# Patient Record
Sex: Female | Born: 1995 | Race: White | Hispanic: No | Marital: Married | Smoking: Never smoker
Health system: Southern US, Community
[De-identification: ages and names within clinical notes are randomized; demographics above are authoritative.]

## PROBLEM LIST (undated history)

## (undated) DIAGNOSIS — Z8781 Personal history of (healed) traumatic fracture: Secondary | ICD-10-CM

## (undated) DIAGNOSIS — N96 Recurrent pregnancy loss: Secondary | ICD-10-CM

## (undated) HISTORY — PX: FRACTURE SURGERY: SHX138

## (undated) HISTORY — DX: Personal history of (healed) traumatic fracture: Z87.81

## (undated) HISTORY — DX: Recurrent pregnancy loss: N96

---
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
## 2013-09-25 ENCOUNTER — Emergency Department: Payer: Self-pay | Admitting: Emergency Medicine

## 2013-09-25 LAB — URINALYSIS, COMPLETE
Bilirubin,UR: NEGATIVE
GLUCOSE, UR: NEGATIVE mg/dL (ref 0–75)
KETONE: NEGATIVE
LEUKOCYTE ESTERASE: NEGATIVE
Nitrite: NEGATIVE
PROTEIN: NEGATIVE
Ph: 7 (ref 4.5–8.0)
RBC,UR: 3 /HPF (ref 0–5)
SPECIFIC GRAVITY: 1.015 (ref 1.003–1.030)
Squamous Epithelial: 10

## 2013-09-25 LAB — CBC
HCT: 37.1 % (ref 35.0–47.0)
HGB: 12.4 g/dL (ref 12.0–16.0)
MCH: 30.1 pg (ref 26.0–34.0)
MCHC: 33.6 g/dL (ref 32.0–36.0)
MCV: 90 fL (ref 80–100)
Platelet: 231 10*3/uL (ref 150–440)
RBC: 4.14 10*6/uL (ref 3.80–5.20)
RDW: 12.8 % (ref 11.5–14.5)
WBC: 5.8 10*3/uL (ref 3.6–11.0)

## 2013-09-25 LAB — HCG, QUANTITATIVE, PREGNANCY: Beta Hcg, Quant.: 26599 m[IU]/mL — ABNORMAL HIGH

## 2013-09-28 ENCOUNTER — Emergency Department: Payer: Self-pay | Admitting: Emergency Medicine

## 2013-09-28 LAB — HCG, QUANTITATIVE, PREGNANCY: BETA HCG, QUANT.: 19786 m[IU]/mL — AB

## 2013-09-28 LAB — CBC
HCT: 36 % (ref 35.0–47.0)
HGB: 11.8 g/dL — ABNORMAL LOW (ref 12.0–16.0)
MCH: 29.5 pg (ref 26.0–34.0)
MCHC: 32.9 g/dL (ref 32.0–36.0)
MCV: 90 fL (ref 80–100)
Platelet: 234 10*3/uL (ref 150–440)
RBC: 4.01 10*6/uL (ref 3.80–5.20)
RDW: 12.9 % (ref 11.5–14.5)
WBC: 5.1 10*3/uL (ref 3.6–11.0)

## 2013-10-10 ENCOUNTER — Ambulatory Visit: Payer: Self-pay | Admitting: Obstetrics and Gynecology

## 2013-10-11 LAB — COMPREHENSIVE METABOLIC PANEL
ALK PHOS: 54 U/L
ALT: 58 U/L
ANION GAP: 5 — AB (ref 7–16)
Albumin: 3.9 g/dL (ref 3.4–5.0)
BILIRUBIN TOTAL: 0.2 mg/dL (ref 0.2–1.0)
BUN: 11 mg/dL (ref 7–18)
CALCIUM: 8.5 mg/dL (ref 8.5–10.1)
Chloride: 106 mmol/L (ref 98–107)
Co2: 28 mmol/L (ref 21–32)
Creatinine: 0.49 mg/dL — ABNORMAL LOW (ref 0.60–1.30)
EGFR (African American): >60
EGFR (Non-African Amer.): >60
Glucose: 94 mg/dL (ref 65–99)
Osmolality: 277 (ref 275–301)
POTASSIUM: 4 mmol/L (ref 3.5–5.1)
SGOT(AST): 49 U/L — ABNORMAL HIGH (ref 15–37)
Sodium: 139 mmol/L (ref 136–145)
TOTAL PROTEIN: 7.2 g/dL (ref 6.4–8.2)

## 2013-10-11 LAB — CBC
HCT: 34.4 % — ABNORMAL LOW (ref 35.0–47.0)
HGB: 11.3 g/dL — AB (ref 12.0–16.0)
MCH: 29.3 pg (ref 26.0–34.0)
MCHC: 32.8 g/dL (ref 32.0–36.0)
MCV: 89 fL (ref 80–100)
PLATELETS: 248 10*3/uL (ref 150–440)
RBC: 3.85 10*6/uL (ref 3.80–5.20)
RDW: 12.7 % (ref 11.5–14.5)
WBC: 7.3 10*3/uL (ref 3.6–11.0)

## 2013-10-11 LAB — HCG, QUANTITATIVE, PREGNANCY: Beta Hcg, Quant.: 3944 m[IU]/mL — ABNORMAL HIGH

## 2013-10-16 LAB — PATHOLOGY REPORT

## 2014-05-03 NOTE — H&P (Signed)
Subjective/Chief Complaint Vaginal bleeding   History of Present Illness 18yo G3P1011 with LMP of 09/26/13 and an EDC of 04/30/13 presents for the third time to the ER with vaginal bleeding and cramping. Confirmed IUP with bhcg of: 9/16 26599 9/19- 19786 10/1- 3944  Confirmed failed pregnancy at last visit, has been expectantly managing as outpatient. However, she has begun passing large clots and is worried, has been bleeding for 2 weeks. +cramping pain, not severe. No headaches, dizziness, light-headedness, pelvic pain, discharge or odor. She is Rh positive, hgb stable.  Pt was on Nuva ring, lost her insurance, and has been using condoms for 1.5 yrs. Sexually active with boyfriend only. Unplanned pregnancy.   Past History Past OB Hx- 1 NSVD, uncomplicated pregnancy 3 yrs ago. 1 SAB, expectantly managed. Has used OCPs in past, made her feel ill. Past medical hx- none Past surgical hx- tibial fx and clavicle fx as child, eye surgery Past family hx- mother with dysmenorrhea and menorrhagia. No gyn cancers.   Primary Physician Patient uncertain - on [address]   Code Status Full Code   Past Med/Surgical Hx:  Denies medical history:   ALLERGIES:  No Known Allergies:   Family and Social History:  Place of Living Home   Review of Systems:  Fever/Chills No   Cough No   Sputum No   Abdominal Pain No   Diarrhea No   Constipation No   Nausea/Vomiting No   SOB/DOE No   Chest Pain No   Telemetry Reviewed Afib   Dysuria No   Tolerating PT Yes   Tolerating Diet Yes   Medications/Allergies Reviewed Medications/Allergies reviewed   Physical Exam:  GEN well developed, well nourished, no acute distress   HEENT PERRL, good dentition   NECK supple   RESP normal resp effort  clear BS   CARD regular rate  no murmur   ABD no liver/spleen enlargement  soft  normal BS  no Adominal Mass   GU Cervix dilated 1 cm, uterus retroverted approx 8 week sized, dark red  blood from os. No cervical motion tenderness, no adnexal tenderness or masses.   EXTR negative cyanosis/clubbing, negative edema   NEURO cranial nerves intact   PSYCH alert, A+O to time, place, person, good insight   Lab Results: Hepatic:  01-Oct-15 23:41   Bilirubin, Total 0.2  Alkaline Phosphatase 54 (46-116 NOTE: New Reference Range 07/30/13)  SGPT (ALT) 58 (14-63 NOTE: New Reference Range 07/30/13)  SGOT (AST)  49  Total Protein, Serum 7.2  Albumin, Serum 3.9  Routine Chem:  01-Oct-15 23:41   Glucose, Serum 94  BUN 11  Creatinine (comp)  0.49  Sodium, Serum 139  Potassium, Serum 4.0  Chloride, Serum 106  CO2, Serum 28  Calcium (Total), Serum 8.5  Osmolality (calc) 277  eGFR (African American) >60  eGFR (Non-African American) >60 (eGFR values <71m/min/1.73 m2 may be an indication of chronic kidney disease (CKD). Calculated eGFR, using the MRDR Study equation, is useful in  patients with stable renal function. The eGFR calculation will not be reliable in acutely ill patients when serum creatinine is changing rapidly. It is not useful in patients on dialysis. The eGFR calculation may not be applicable to patients at the low and high extremes of body sizes, pregnant women, and vetetarians.)  Anion Gap  5  HCG Betasubunit Quant. Serum  3944 (1-3  (International Unit)  ----------------- Non-pregnant <5 Weeks Post LMP mIU/mL  3- 4 wk 9 - 130  4- 5  wk 75 - 2,600  5- 6 wk 850 - 20,800  6- 7 wk 4,000 - 100,000  7-12 wk 11,500 - 289,000 12-16 wk 18,000 - 137,000 16-29 wk 1,400 - 53,000 29-41 wk 940 - 60,000)  Routine Hem:  01-Oct-15 23:41   WBC (CBC) 7.3  RBC (CBC) 3.85  Hemoglobin (CBC)  11.3  Hematocrit (CBC)  34.4  Platelet Count (CBC) 248 (Result(s) reported on 11 Oct 2013 at 12:03AM.)  MCV 89  MCH 29.3  MCHC 32.8  RDW 12.7   Radiology Results: [place]:    02-Oct-15 02:16, US OB Less Than 14 Weeks w/ Transvaginal  US OB Less Than 14 Weeks w/  Transvaginal  REASON FOR EXAM:    pelvic pain, vaginal bleeding  COMMENTS:       PROCEDURE: [place]  - US OB LESS THAN 14 WEEKS/W TRANS  - Oct 11 2013  2:16AM     CLINICAL DATA:  Pregnant, vaginal bleeding, declining beta HCG  (3944)    EXAM:  OBSTETRIC <14 WK [place] AND TRANSVAGINAL OB US    TECHNIQUE:  Both transabdominal and transvaginal ultrasound examinations were  performed for complete evaluation of the gestation as well as the  maternal uterus, adnexal regions, and pelvic cul-de-sac.  Transvaginal technique wasperformed to assess early pregnancy.    COMPARISON:  09/25/2013    FINDINGS:  Intrauterine gestational sac: Visualized, irregular in shape    Yolk sac:  Present    Embryo:  Not visualized    Irregular gestational sac with mean sac diameter 29.1 mm, estimated  gestational age [redacted] weeks 8 days.    Maternal uterus/adnexae: No subchorionic hemorrhage.  Abnormal soft tissue/debris within the gestational sac without  definite fetal pole.    Fluid within the endocervical canal.    Bilateral ovaries are not discretely visualized.    No free fluid.     IMPRESSION:  Failed intrauterine pregnancy, as above.      Electronically Signed    By: Julian Hy M.D.    On: 10/11/2013 02:41         Verified By: Julian Hy, M.D.,    Assessment/Admission Diagnosis Incomplete abortion with continued bleeding  TVUS GSD cm with yolk sac cm and no fetal pole   Plan To OR for D&C. Consents signed with patient. No Rhogam indicated. Risks, benefits and alternatives discussed at length with patient. She opts for surgical management given several weeks of bleeding and continued cramping and clots. Preop Doxycycline x1 F/u 2 weeks postop Contraception offered. Pt to consider before preop.   Electronic Signatures: Angelina Pih (MD)  (Signed 02-Oct-15 06:13)  Authored: CHIEF COMPLAINT and HISTORY, PAST MEDICAL/SURGIAL HISTORY, ALLERGIES, FAMILY AND SOCIAL HISTORY,  REVIEW OF SYSTEMS, PHYSICAL EXAM, LABS, Radiology, ASSESSMENT AND PLAN   Last Updated: 02-Oct-15 06:13 by Angelina Pih (MD)

## 2014-05-03 NOTE — Op Note (Signed)
PATIENT NAME:  Sharon Houston, Sharon Houston MR#:  [id number] DATE OF BIRTH:  [birth year]  DATE OF PROCEDURE:  10/11/2013  PREOPERATIVE DIAGNOSIS:  Incomplete abortion.    POSTOPERATIVE DIAGNOSIS:  Incomplete abortion.     PROCEDURE:   Suction dilatation and curettage.    ATTENDING:  Cline Cools, MD    ASSISTANT:  None.    ESTIMATED BLOOD LOSS:  200 mL.    INTRAVENOUS FLUIDS:  400 mL   URINE OUTPUT: 200 mL.    FINDINGS:  Approximately 10 week size uterus with products of conception.  Cervix dilated to 1 cm.    INDICATION FOR PROCEDURE:  Sharon Houston is a 19 year old, gravida 3, para 1, 0-1-1 who presents to the Emergency Room with vaginal bleeding.  Her last menstrual period was 07/26/2013 giving her an estimated date of confinement of 11 weeks by dates.  The patient had previously been diagnosed with an incomplete abortion on 09/28/2013 when she presented to the Emergency Room with vaginal bleeding and a declining beta hCG.  She had opted for expectant management at that time, however, on her presentation to the ER this time her beta hCG was 3900 and an ultrasound confirmed an empty gestational sac of 3.9 cm with no fetal pole and tissue in the cervix.  These findings were discussed with the patient and she elected for surgical management at this time.  The risks of the procedure were discussed with the patient including increasing bleeding, infection, uterine perforation, uterine adhesions as well as the benefits of the procedure including shorter time to resolution of her incomplete abortion.  The alternatives of the procedure were also discussed with the patient. She gave verbal and written consent to go ahead with D and C.    DESCRIPTION OF PROCEDURE: The patient was taken to the operating room where she was identified by name and birthdate.  She was placed do on the operating table in the supine position and general anesthesia was induced without difficulty.  The patient was then placed in  dorsal-lithotomy and she was prepped and draped in the usual sterile fashion.  A single dose of doxycycline was given prior to the start of the procedure.  A formal timeout was performed with all team members present and in agreement.    On exam under anesthesia, the patient was noted to have a significantly retroverted uterus of approximately 10 week size.  Her cervix was dilated to 1 cm and bright red bleeding was noted from the cervical os.    A weighted speculum was placed in the vagina.  The cervix was grasped with a single tooth tenaculum.  A 21 French Hank dilator was introduced into the cervical os, which was easily performed and the cervix did not require further dilation.  A size 8 mm rigid curved suction curette was then introduced into the uterus carefully against the fundus and suction curettage was performed for products of  conception. Sharp curettage was used to assure a gritty texture in all quadrants of the uterus.  Bimanual massage was used to firm up the fundus.  There was continued bright red bleeding and 1 dose of 0.2 mg of IM Methergine was given.    At the close of the procedure bleeding from the cervical os was very minimal, the fundus was firm and all products had been removed and sent to pathology.  The single tooth tenaculum was then removed from the anterior lip of the cervix.  Hemostasis was assured using pressure only at  the tenaculum sites.  The weight speculum was then removed from the vagina and the procedure was completed.    The patient tolerated this procedure well and was removed in stable condition to the postop care area.      ____________________________ Cline Cools, MD beb:AT D: 10/11/2013 17:10:40 ET T: 10/12/2013 07:00:38 ET JOB#: [id number]  cc: Cline Cools, MD, <Dictator> Cline Cools MD ELECTRONICALLY SIGNED 11/13/2013 17:52

## 2015-06-28 IMAGING — US US OB < 14 WEEKS - US OB TV
1 series · 14 of 28 positions shown · non-contrast
Comparison: None.

CLINICAL DATA: Eight weeks pregnant, vaginal bleeding and cramping.
Estimated gestational age last menstrual period equals 8 weeks 5
days.

EXAM:
OBSTETRIC <14 WK US AND TRANSVAGINAL OB US
TECHNIQUE: Both transabdominal and transvaginal ultrasound examinations were
performed for complete evaluation of the gestation as well as the
maternal uterus, adnexal regions, and pelvic cul-de-sac.
Transvaginal technique was performed to assess early pregnancy.

[Series 1: us ob < 14 weeks - us ob tv · 0.24mm/px · 75 acquisitions, 14 frames shown]
[im 3/75]
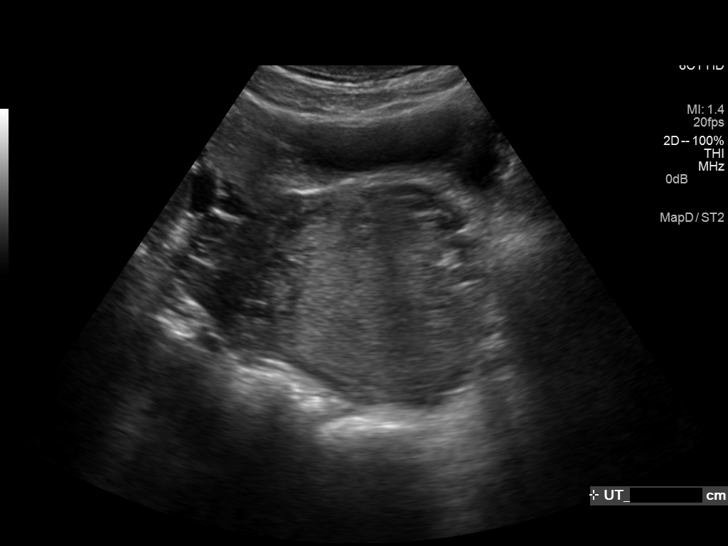
[im 9/75]
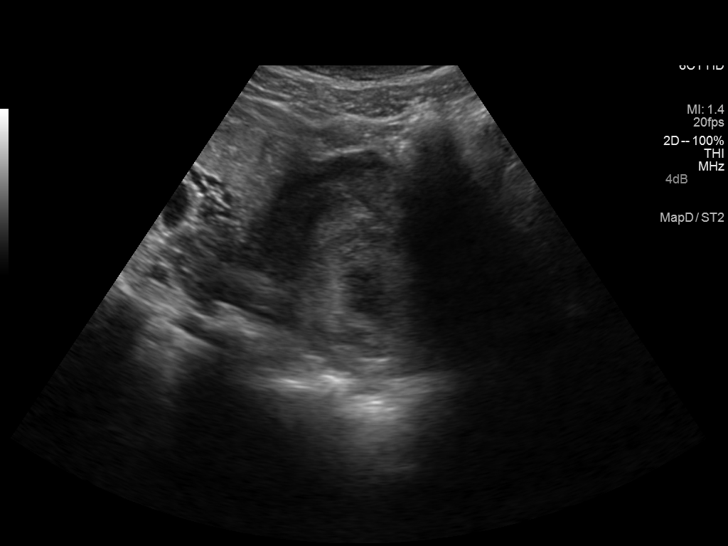
[im 14/75]
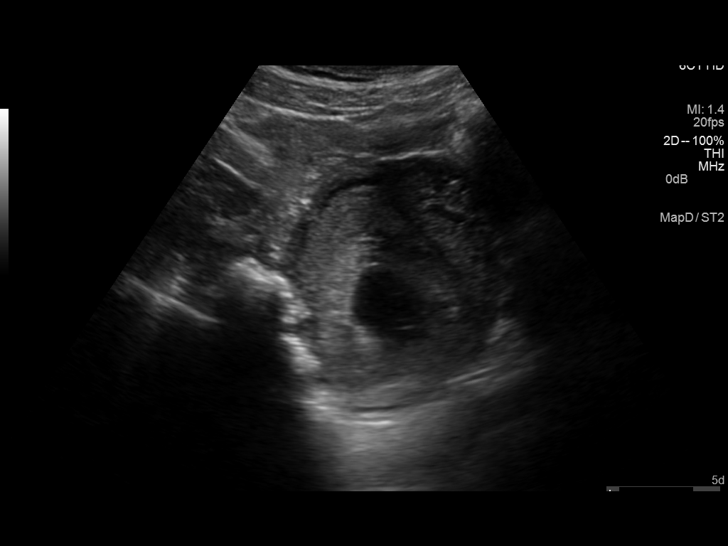
[im 20/75]
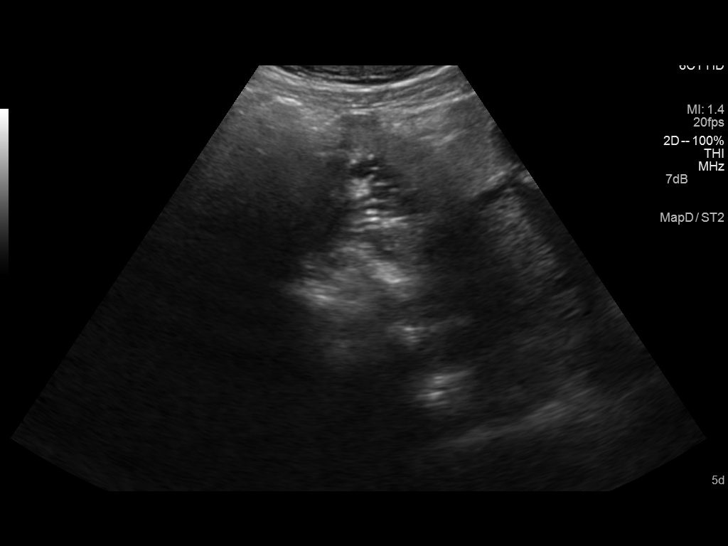
[im 25/75]
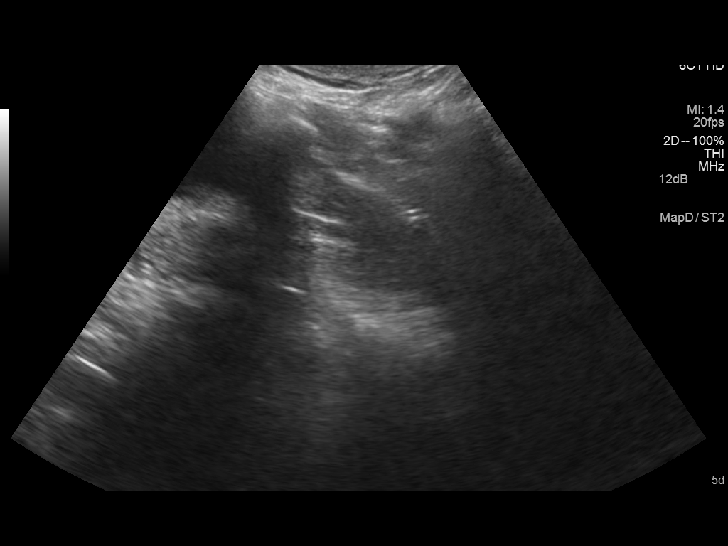
[im 31/75]
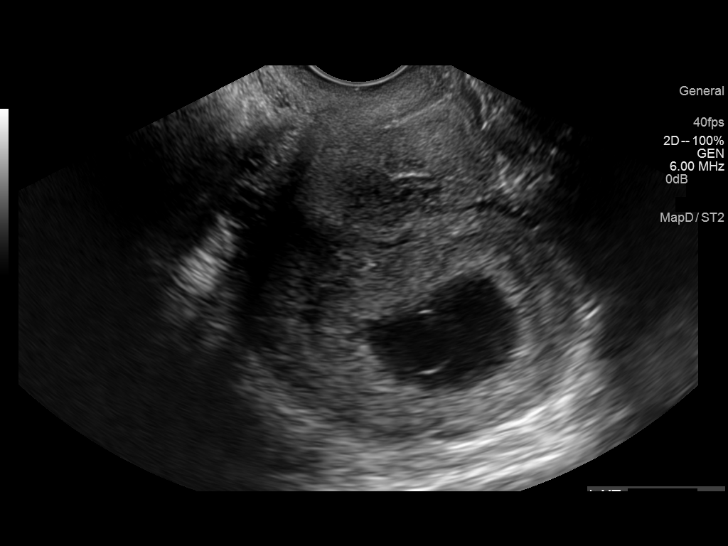
[im 36/75]
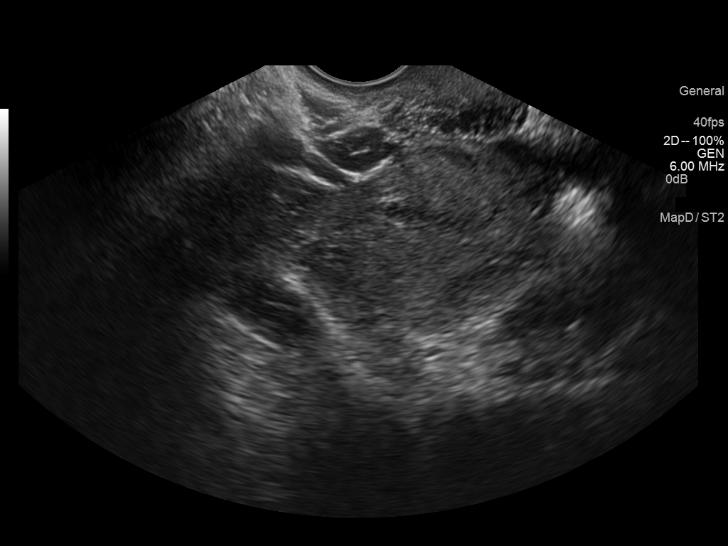
[im 42/75]
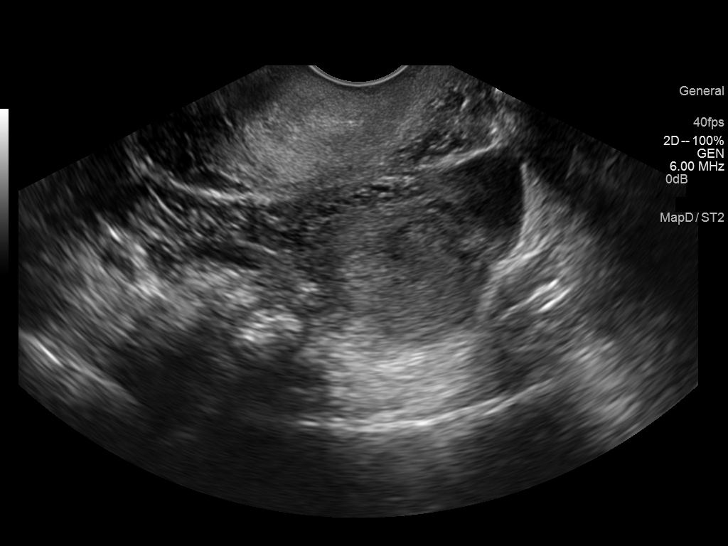
[im 47/75]
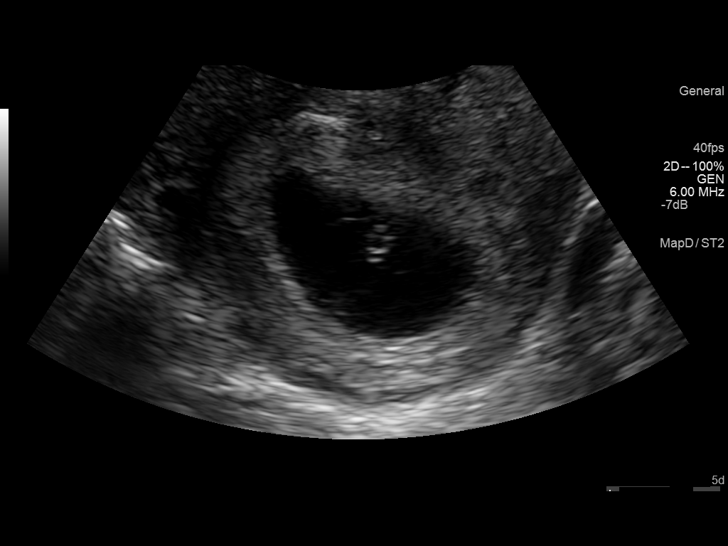
[im 53/75]
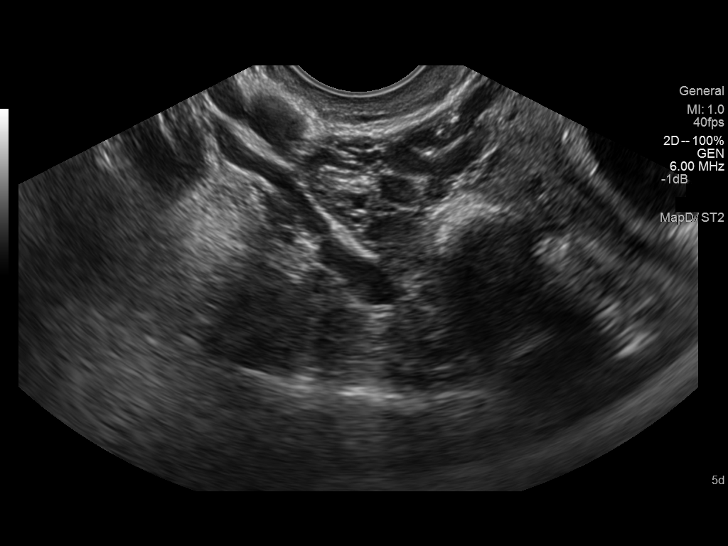
[im 58/75]
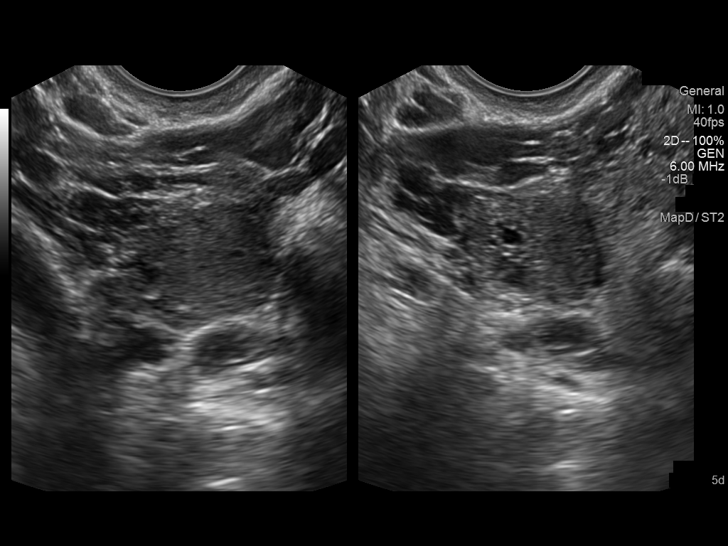
[im 64/75]
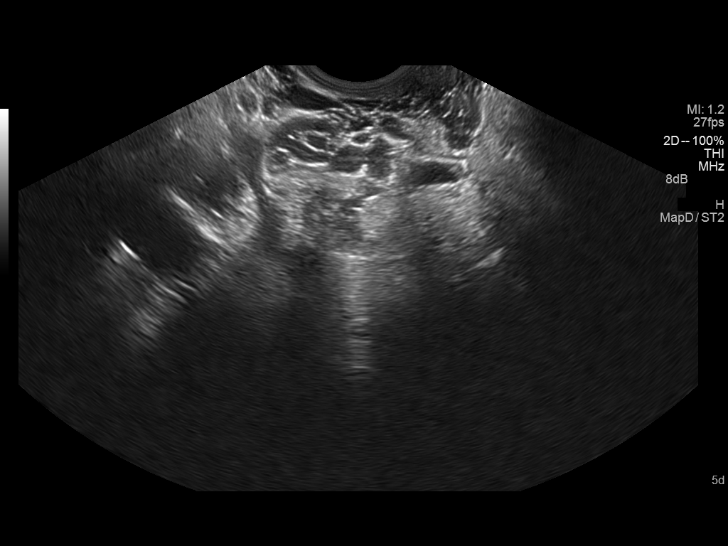
[im 69/75]
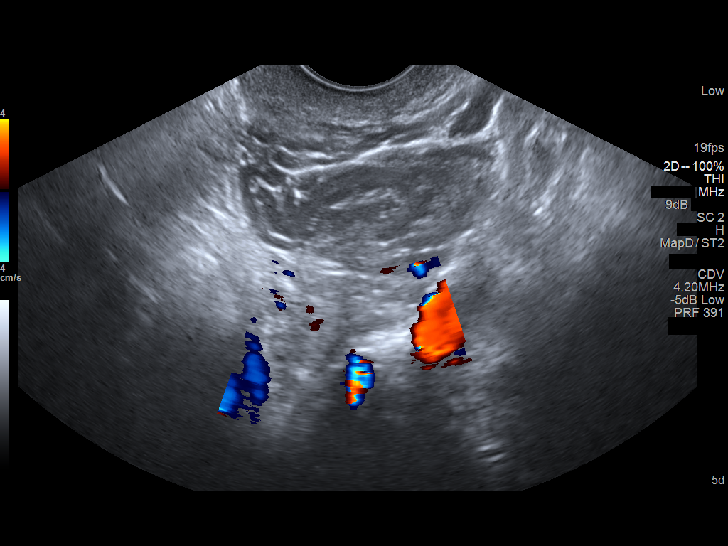
[im 75/75]
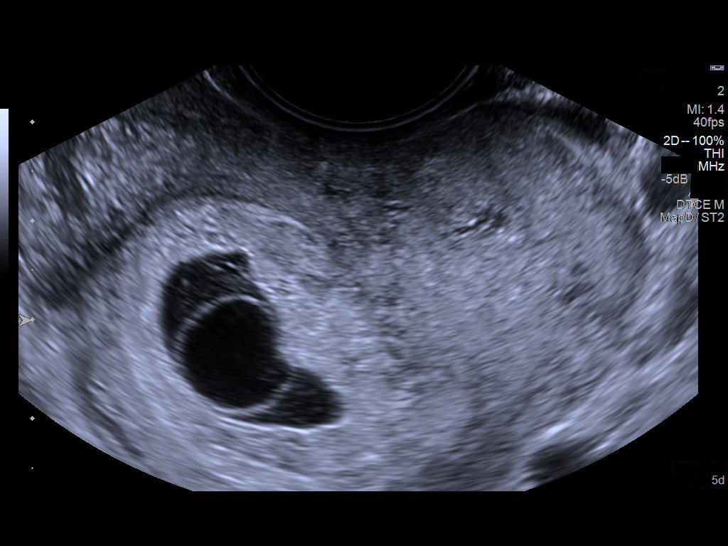

[14 of 28 positions shown; findings below may reference images not displayed]

FINDINGS: Intrauterine gestational sac: Single

Yolk sac:  Present

Embryo:  Present

Cardiac Activity: Present

Heart Rate:  114 bpm

CRL:   5.3  mm   7 w 4 d                  US EDC: 05/14/2014

Maternal uterus/adnexae: Normal ovaries. Potential small chorionic
hemorrhage. The gestational sac appears slightly small with some
internal echoes which could indicate hemorrhage. No free fluid.
Small volume free fluid
IMPRESSION: 1. Single intrauterine gestation with cardiac activity.
2. Estimated gestational age by crown-rump length equals 7 weeks 4
days.
3. Gestational sac is slightly irregular which could could indicate
threatened abortion.

## 2015-07-14 IMAGING — US US OB < 14 WEEKS - US OB TV
1 series · 14 of 28 positions shown · non-contrast
Comparison: 09/25/2013

CLINICAL DATA: Pregnant, vaginal bleeding, declining beta HCG
(6600)

EXAM:
OBSTETRIC <14 WK US AND TRANSVAGINAL OB US
TECHNIQUE: Both transabdominal and transvaginal ultrasound examinations were
performed for complete evaluation of the gestation as well as the
maternal uterus, adnexal regions, and pelvic cul-de-sac.
Transvaginal technique was performed to assess early pregnancy.

[Series 1: us ob < 14 weeks - us ob tv · 0.21mm/px · 42 acquisitions, 14 frames shown]
[im 2/42]
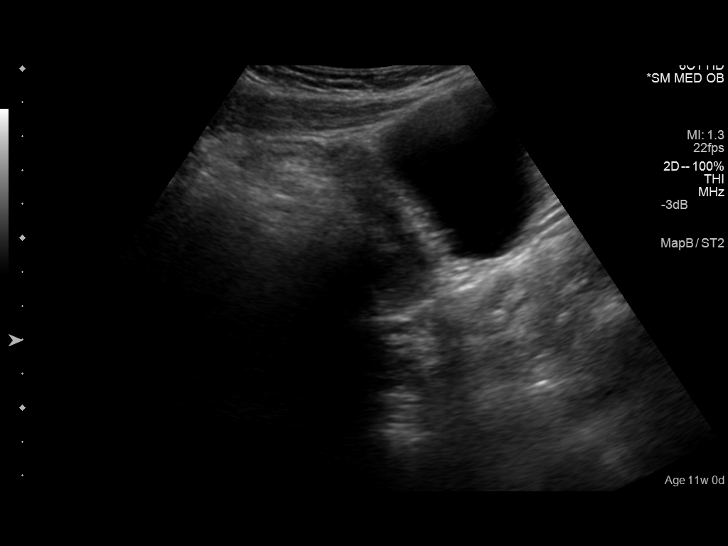
[im 5/42]
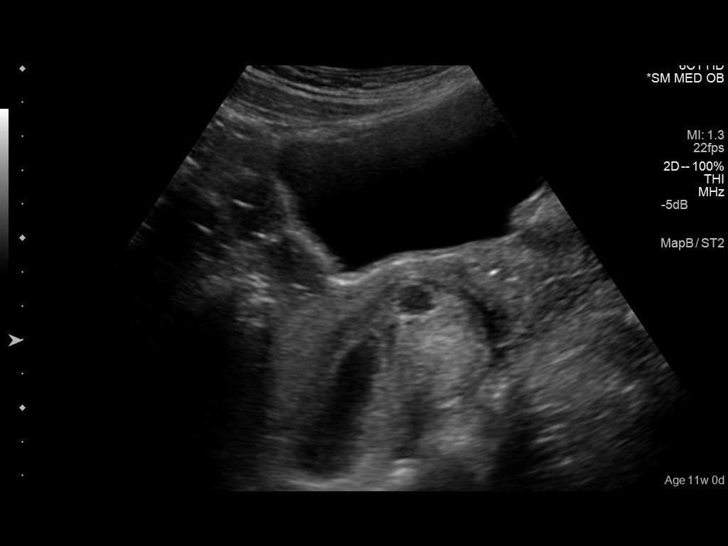
[im 8/42]
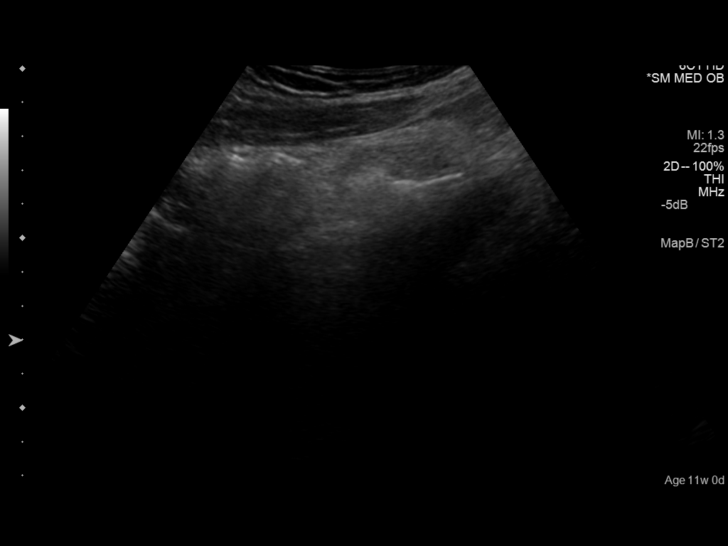
[im 11/42]
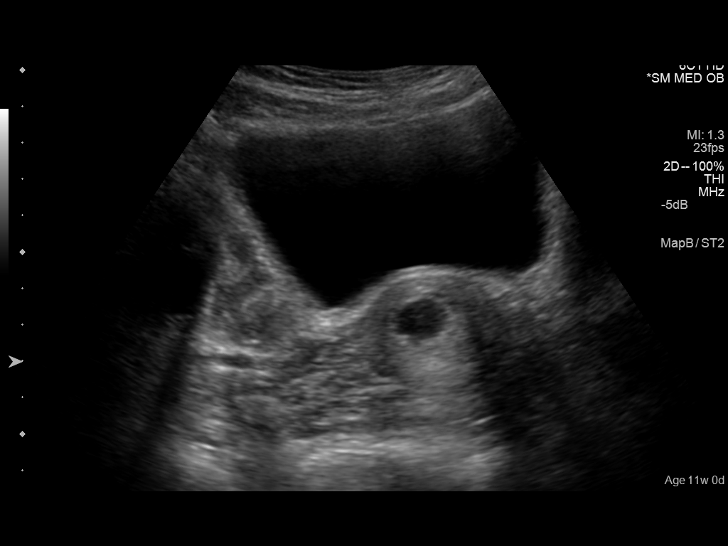
[im 14/42]
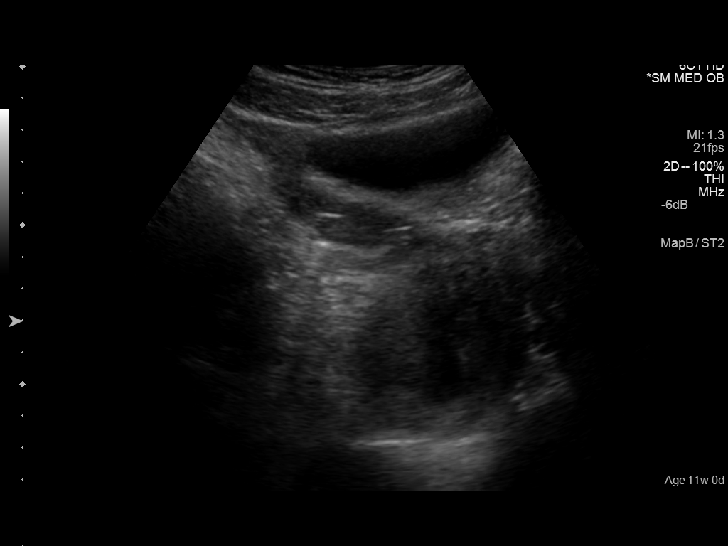
[im 17/42]
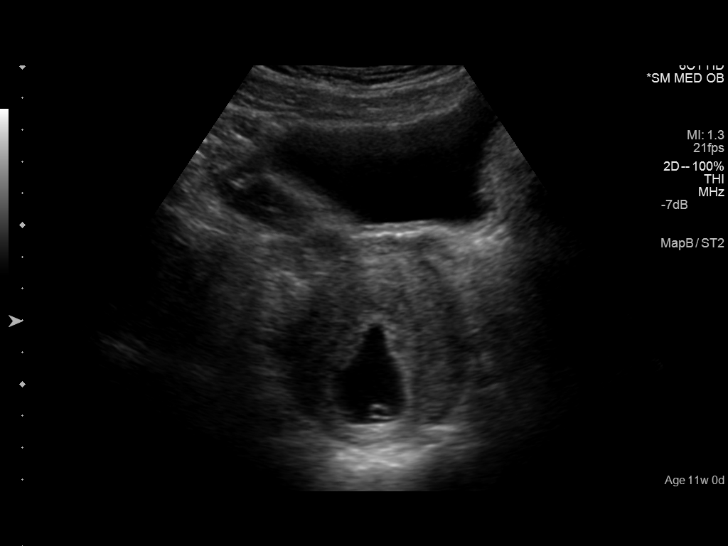
[im 20/42]
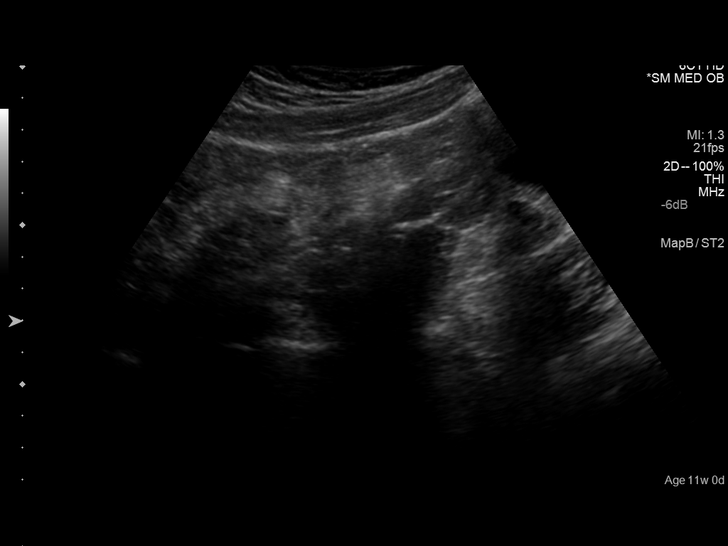
[im 23/42]
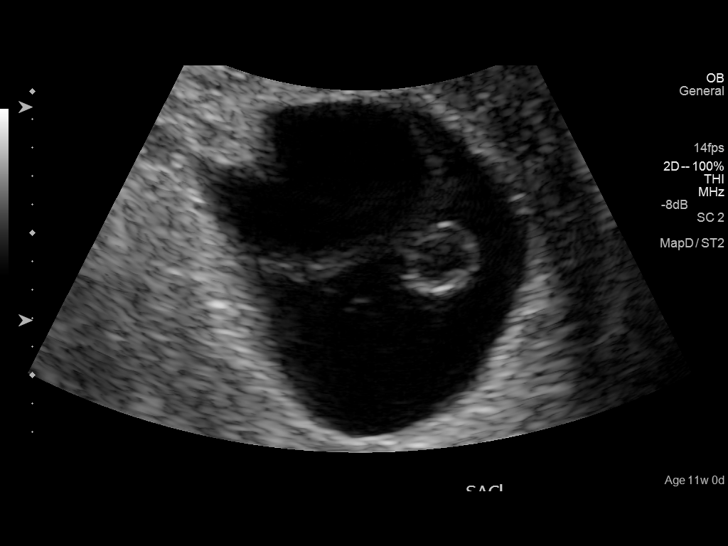
[im 26/42]
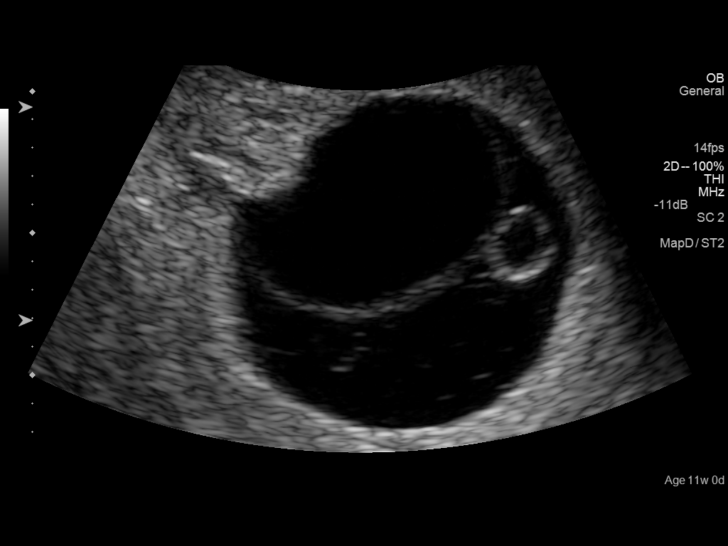
[im 29/42]
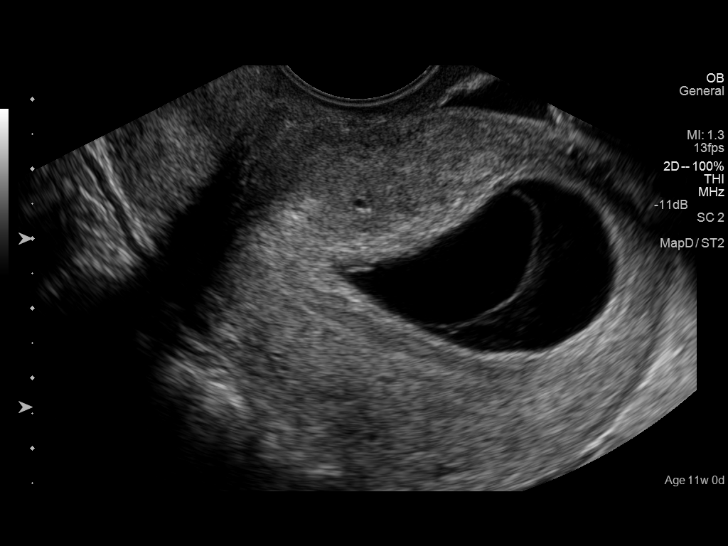
[im 32/42]
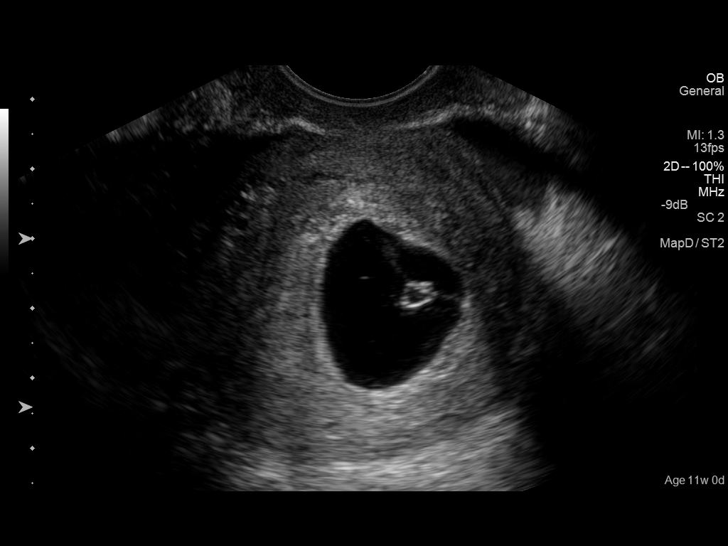
[im 35/42]
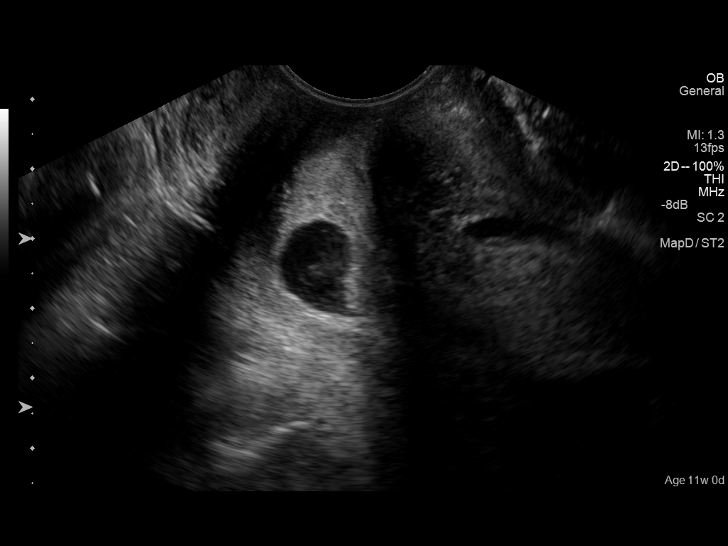
[im 38/42]
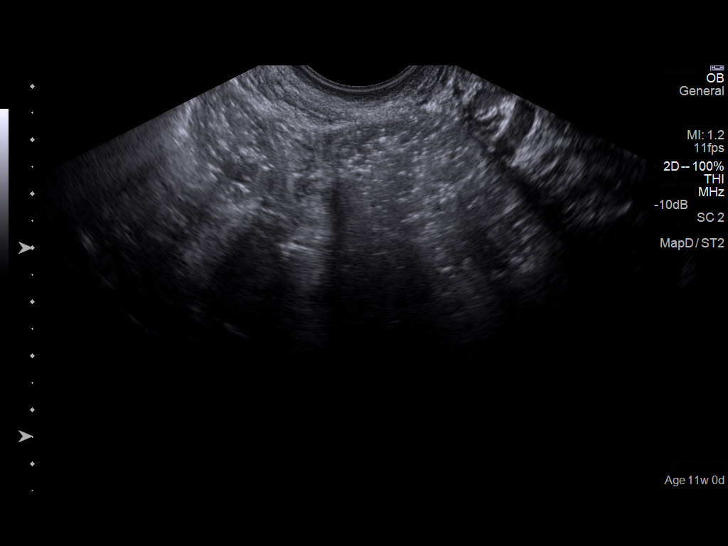
[im 42/42]
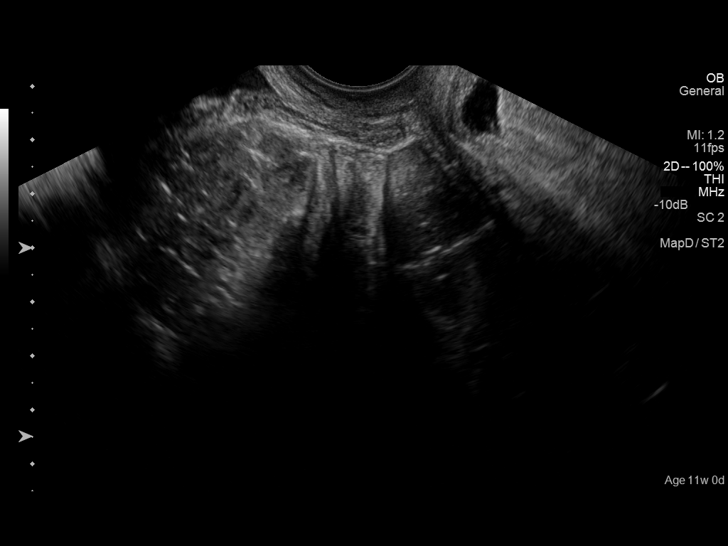

[14 of 28 positions shown; findings below may reference images not displayed]

FINDINGS: Intrauterine gestational sac: Visualized, irregular in shape

Yolk sac:  Present

Embryo:  Not visualized

Irregular gestational sac with mean sac diameter 29.1 mm, estimated
gestational age 7 weeks 8 days.

Maternal uterus/adnexae: No subchorionic hemorrhage.

Abnormal soft tissue/debris within the gestational sac without
definite fetal pole.

Fluid within the endocervical canal.

Bilateral ovaries are not discretely visualized.

No free fluid.
IMPRESSION: Failed intrauterine pregnancy, as above.

## 2016-06-10 ENCOUNTER — Emergency Department
Admission: EM | Admit: 2016-06-10 | Discharge: 2016-06-10 | Disposition: A | Discharge status: Home / Self Care | Payer: Medicaid Other | Attending: Emergency Medicine | Admitting: Emergency Medicine

## 2016-06-10 ENCOUNTER — Encounter: Payer: Self-pay | Admitting: Emergency Medicine

## 2016-06-10 DIAGNOSIS — N898 Other specified noninflammatory disorders of vagina: Secondary | ICD-10-CM | POA: Insufficient documentation

## 2016-06-10 LAB — URINALYSIS, COMPLETE (UACMP) WITH MICROSCOPIC
BILIRUBIN URINE: NEGATIVE
Bacteria, UA: NONE SEEN
GLUCOSE, UA: NEGATIVE mg/dL
Hgb urine dipstick: NEGATIVE
Ketones, ur: NEGATIVE mg/dL
LEUKOCYTES UA: NEGATIVE
Nitrite: NEGATIVE
PH: 6 (ref 5.0–8.0)
Protein, ur: NEGATIVE mg/dL
Specific Gravity, Urine: 1.013 (ref 1.005–1.030)

## 2016-06-10 LAB — CHLAMYDIA/NGC RT PCR (ARMC ONLY)
Chlamydia Tr: NOT DETECTED
N GONORRHOEAE: NOT DETECTED

## 2016-06-10 LAB — WET PREP, GENITAL
Clue Cells Wet Prep HPF POC: NONE SEEN
Sperm: NONE SEEN
Trich, Wet Prep: NONE SEEN
Yeast Wet Prep HPF POC: NONE SEEN

## 2016-06-10 LAB — POCT PREGNANCY, URINE: PREG TEST UR: NEGATIVE

## 2016-06-10 NOTE — ED Triage Notes (Signed)
Pt c/o white vaginal discharge with odor for 2 months. Tried monostat OTC without relief.  Friend had same sx and had BV and pt thinks may have same. NAD. No pain just irriation per pt.

## 2016-06-10 NOTE — ED Provider Notes (Signed)
[facility] Emergency Department Provider Note  ____________________________________________   First MD Initiated Contact with Patient 06/10/16 0818     (approximate)  I have reviewed the triage vital signs and the nursing notes.   HISTORY  Chief Complaint Vaginal Discharge    HPI Sharon Houston is a 21 y.o. female is here complaining of vaginal dischargeand odor for approximately 2 months. Patient states she is use Monistat over-the-counter without any relief. She is sexually active and does not use birth control or condoms. She states she has been with the same partner but is not completely sure that he is monogamous. Patient denies any pain but states that there is moderate amount of irritation. She rates her pain as a 0/10.   History reviewed. No pertinent past medical history.  There are no active problems to display for this patient.   History reviewed. No pertinent surgical history.  Prior to Admission medications   Medication Sig Start Date End Date Taking? Authorizing Provider  acetaminophen (TYLENOL) 500 MG tablet Take 1,000 mg by mouth every 6 (six) hours as needed for pain.    [provider]  ondansetron (ZOFRAN ODT) 4 MG disintegrating tablet Take 1 tablet (4 mg total) by mouth every 8 (eight) hours as needed for nausea. 02/23/12   Santiago GladLaisure, Heather, PA-C    Allergies Patient has no known allergies.  History reviewed. No pertinent family history.  Social History Social History  Substance Use Topics  . Smoking status: Never Smoker  . Smokeless tobacco: Never Used  . Alcohol use Yes    Review of Systems Constitutional: No fever/chills Cardiovascular: Denies chest pain. Respiratory: Denies shortness of breath. Gastrointestinal: No abdominal pain.  No nausea, no vomiting.   Genitourinary: Positive for vaginal discharge. Musculoskeletal: Negative for back pain. Skin: Negative for rash. Neurological: Negative for  headaches, focal weakness or numbness.   ____________________________________________   PHYSICAL EXAM:  VITAL SIGNS: ED Triage Vitals   Enc Vitals Group     BP 129/89     Pulse Rate 79     Resp 16     Temp 98.4 F (36.9 C)     Temp Source Oral     SpO2 100 %     Weight 150 lb (68 kg)     Height 5\' 7"  (1.702 m)     Head Circumference      Peak Flow      Pain Score      Pain Loc      Pain Edu?      Excl. in GC?     Constitutional: Alert and oriented. Well appearing and in no acute distress. Eyes: Conjunctivae are normal. PERRL. EOMI. Head: Atraumatic. Neck: No stridor.   Cardiovascular: Normal rate, regular rhythm. Grossly normal heart sounds.  Good peripheral circulation. Respiratory: Normal respiratory effort.  No retractions. Lungs CTAB. Gastrointestinal: Soft and nontender. No distention.  Genitourinary: No erythema or discharge noted exterior genitalia. There is minimal secretions noted on vaginal exam. There is no adnexal masses or tenderness noted. Negative for chandelier sign. Wet prep and cultures were obtained. Musculoskeletal: No lower extremity tenderness nor edema.  No joint effusions. Neurologic:  Normal speech and language. No gross focal neurologic deficits are appreciated. No gait instability. Skin:  Skin is warm, dry and intact. No rash noted. Psychiatric: Mood and affect are normal. Speech and behavior are normal.  ____________________________________________   LABS (all labs ordered are listed, but only abnormal results are displayed)  Labs Reviewed  WET PREP, GENITAL - Abnormal; Notable for the following:       Result Value   WBC, Wet Prep HPF POC FEW (*)    All other components within normal limits  URINALYSIS, COMPLETE (UACMP) WITH MICROSCOPIC - Abnormal; Notable for the following:    Color, Urine YELLOW (*)    APPearance HAZY (*)    Squamous Epithelial / LPF 6-30 (*)    All other components within normal limits  CHLAMYDIA/NGC RT PCR ([facility]  ONLY)  POC URINE PREG, ED  POCT PREGNANCY, URINE    PROCEDURES  Procedure(s) performed: None  Procedures  Critical Care performed: No  ____________________________________________   INITIAL IMPRESSION / ASSESSMENT AND PLAN / ED COURSE  Pertinent labs & imaging results that were available during my care of the patient were reviewed by me and considered in my medical decision making (see chart for details).  Patient was made aware that her urinalysis, wet prep were negative and did not explain her symptoms. Patient will be called if her gonorrhea or chlamydia test is positive. Patient is to follow-up with gynecologist of her choice or the gynecologist on call is listed on her discharge papers to follow-up with.     ____________________________________________   FINAL CLINICAL IMPRESSION(S) / ED DIAGNOSES  Final diagnoses:  Vaginal itching      NEW MEDICATIONS STARTED DURING THIS VISIT:  Discharge Medication List as of 06/10/2016 10:37 AM       Note:  This document was prepared using Dragon voice recognition software and may include unintentional dictation errors.    Tommi Rumps, PA-C 06/10/16 1615    Jeanmarie Plant, MD 06/10/16 [phone]

## 2016-06-10 NOTE — Discharge Instructions (Signed)
Follow-up with GYN doctor listed on your discharge papers if any continued problems. We will call on the results of testing done today when the results are received.

## 2018-03-20 ENCOUNTER — Encounter: Payer: Self-pay | Admitting: Emergency Medicine

## 2018-03-20 ENCOUNTER — Emergency Department
Admission: EM | Admit: 2018-03-20 | Discharge: 2018-03-20 | Disposition: A | Discharge status: Home / Self Care | Payer: Self-pay | Attending: Emergency Medicine | Admitting: Emergency Medicine

## 2018-03-20 ENCOUNTER — Other Ambulatory Visit: Payer: Self-pay

## 2018-03-20 DIAGNOSIS — R05 Cough: Secondary | ICD-10-CM | POA: Insufficient documentation

## 2018-03-20 DIAGNOSIS — Z20828 Contact with and (suspected) exposure to other viral communicable diseases: Secondary | ICD-10-CM | POA: Insufficient documentation

## 2018-03-20 DIAGNOSIS — R0981 Nasal congestion: Secondary | ICD-10-CM | POA: Insufficient documentation

## 2018-03-20 LAB — INFLUENZA PANEL BY PCR (TYPE A & B)
INFLAPCR: NEGATIVE
INFLBPCR: NEGATIVE

## 2018-03-20 LAB — GROUP A STREP BY PCR: GROUP A STREP BY PCR: NOT DETECTED

## 2018-03-20 NOTE — ED Provider Notes (Signed)
[facility] Emergency Department Provider Note  ____________________________________________   First MD Initiated Contact with Patient 03/20/18 2036     (approximate)  I have reviewed the triage vital signs and the nursing notes.   HISTORY  Chief Complaint Sore Throat    HPI Sharon Houston is a 23 y.o. female presents emergency department complaining of influenza exposure.  She is complaining of runny nose congestion cough and sore throat.  She denies any vomiting or diarrhea.  Symptoms x1 day.    History reviewed. No pertinent past medical history.  There are no active problems to display for this patient.   History reviewed. No pertinent surgical history.  Prior to Admission medications   Medication Sig Start Date End Date Taking? Authorizing Provider  acetaminophen (TYLENOL) 500 MG tablet Take 1,000 mg by mouth every 6 (six) hours as needed for pain.    [provider]  ondansetron (ZOFRAN ODT) 4 MG disintegrating tablet Take 1 tablet (4 mg total) by mouth every 8 (eight) hours as needed for nausea. 02/23/12   Santiago Glad, PA-C    Allergies Patient has no known allergies.  No family history on file.  Social History Social History   Tobacco Use  . Smoking status: Never Smoker  . Smokeless tobacco: Never Used  Substance Use Topics  . Alcohol use: Yes  . Drug use: No    Review of Systems  Constitutional: No fever/chills Eyes: No visual changes. ENT: Positive for congestion and sore throat. Respiratory: Positive cough Genitourinary: Negative for dysuria. Musculoskeletal: Negative for back pain. Skin: Negative for rash.    ____________________________________________   PHYSICAL EXAM:  VITAL SIGNS: ED Triage Vitals  Enc Vitals Group     BP 03/20/18 1957 123/82     Pulse Rate 03/20/18 1957 93     Resp 03/20/18 1957 16     Temp 03/20/18 1957 98.2 F (36.8 C)     Temp Source 03/20/18 1957 Oral     SpO2  03/20/18 1957 100 %     Weight 03/20/18 1937 150 lb (68 kg)     Height 03/20/18 1937 5\' 7"  (1.702 m)     Head Circumference --      Peak Flow --      Pain Score 03/20/18 1937 3     Pain Loc --      Pain Edu? --      Excl. in GC? --     Constitutional: Alert and oriented. Well appearing and in no acute distress. Eyes: Conjunctivae are normal.  Head: Atraumatic. Ears: TMs are clear bilaterally Nose: No congestion/rhinnorhea. Mouth/Throat: Mucous membranes are moist.   Neck:  supple no lymphadenopathy noted Cardiovascular: Normal rate, regular rhythm. Heart sounds are normal Respiratory: Normal respiratory effort.  No retractions, lungs c t a  GU: deferred Musculoskeletal: FROM all extremities, warm and well perfused Neurologic:  Normal speech and language.  Skin:  Skin is warm, dry and intact. No rash noted. Psychiatric: Mood and affect are normal. Speech and behavior are normal.  ____________________________________________   LABS (all labs ordered are listed, but only abnormal results are displayed)  Labs Reviewed  GROUP A STREP BY PCR  INFLUENZA PANEL BY PCR (TYPE A & B)   ____________________________________________   ____________________________________________  RADIOLOGY    ____________________________________________   PROCEDURES  Procedure(s) performed: No  Procedures    ____________________________________________   INITIAL IMPRESSION / ASSESSMENT AND PLAN / ED COURSE  Pertinent labs & imaging results that were available  during my care of the patient were reviewed by me and considered in my medical decision making (see chart for details).   Patient is a 23 year old female presents emergency department with cold symptoms and flu exposure.  Physical exam patient appears well and the exam is basically unremarkable.  Flu and strep test were ordered from triage are both negative  Explained the findings to the patient.  She is taking  over-the-counter cold medications.  Follow-up with her regular doctor if not improving in 1 week.  Return emergency department worsening.  States she understands will comply appears discharged stable condition.     As part of my medical decision making, I reviewed the following data within the electronic MEDICAL RECORD NUMBER History obtained from family, Labs reviewed flu and strep are negative, Old chart reviewed, Notes from prior ED visits and [place] Controlled Substance Database  ____________________________________________   FINAL CLINICAL IMPRESSION(S) / ED DIAGNOSES  Final diagnoses:  Exposure to influenza      NEW MEDICATIONS STARTED DURING THIS VISIT:  Discharge Medication List as of 03/20/2018  9:15 PM       Note:  This document was prepared using Dragon voice recognition software and may include unintentional dictation errors.    Faythe Ghee, PA-C 03/20/18 2257    Jeanmarie Plant, MD 03/21/18 [phone]

## 2018-03-20 NOTE — ED Triage Notes (Signed)
Patient ambulatory to triage with steady gait, without difficulty or distress noted, mask in place; here with SO to be seen for same symptoms; reports since yesterday having sore throat, fever, runny nose & cough; recent exposure to influenza

## 2018-03-20 NOTE — ED Notes (Signed)
Strep and flu swab sent to lab by this EDT

## 2019-10-30 DIAGNOSIS — Z1152 Encounter for screening for COVID-19: Secondary | ICD-10-CM | POA: Diagnosis not present

## 2019-10-30 DIAGNOSIS — Z03818 Encounter for observation for suspected exposure to other biological agents ruled out: Secondary | ICD-10-CM | POA: Diagnosis not present

## 2020-03-28 ENCOUNTER — Emergency Department
Admission: EM | Admit: 2020-03-28 | Discharge: 2020-03-28 | Disposition: A | Discharge status: Home / Self Care | Payer: Medicaid Other | Attending: Emergency Medicine | Admitting: Emergency Medicine

## 2020-03-28 ENCOUNTER — Other Ambulatory Visit: Payer: Self-pay

## 2020-03-28 ENCOUNTER — Emergency Department: Payer: Medicaid Other

## 2020-03-28 DIAGNOSIS — O26891 Other specified pregnancy related conditions, first trimester: Secondary | ICD-10-CM | POA: Diagnosis not present

## 2020-03-28 DIAGNOSIS — O208 Other hemorrhage in early pregnancy: Secondary | ICD-10-CM | POA: Insufficient documentation

## 2020-03-28 DIAGNOSIS — Z3A09 9 weeks gestation of pregnancy: Secondary | ICD-10-CM | POA: Insufficient documentation

## 2020-03-28 DIAGNOSIS — R252 Cramp and spasm: Secondary | ICD-10-CM | POA: Insufficient documentation

## 2020-03-28 DIAGNOSIS — O26851 Spotting complicating pregnancy, first trimester: Secondary | ICD-10-CM

## 2020-03-28 DIAGNOSIS — O4690 Antepartum hemorrhage, unspecified, unspecified trimester: Secondary | ICD-10-CM

## 2020-03-28 LAB — BASIC METABOLIC PANEL
Anion gap: 7 (ref 5–15)
BUN: 15 mg/dL (ref 6–20)
CO2: 26 mmol/L (ref 22–32)
Calcium: 9.3 mg/dL (ref 8.9–10.3)
Chloride: 103 mmol/L (ref 98–111)
Creatinine, Ser: 0.46 mg/dL (ref 0.44–1.00)
GFR, Estimated: >60 mL/min (ref >60)
Glucose, Bld: 89 mg/dL (ref 70–99)
Potassium: 3.9 mmol/L (ref 3.5–5.1)
Sodium: 136 mmol/L (ref 135–145)

## 2020-03-28 LAB — CBC WITH DIFFERENTIAL/PLATELET
Abs Immature Granulocytes: 0.03 10*3/uL (ref 0.00–0.07)
Basophils Absolute: 0 10*3/uL (ref 0.0–0.1)
Basophils Relative: 0 %
Eosinophils Absolute: 0.1 10*3/uL (ref 0.0–0.5)
Eosinophils Relative: 1 %
HCT: 34.7 % — ABNORMAL LOW (ref 36.0–46.0)
Hemoglobin: 11.7 g/dL — ABNORMAL LOW (ref 12.0–15.0)
Immature Granulocytes: 0 %
Lymphocytes Relative: 29 %
Lymphs Abs: 2.4 10*3/uL (ref 0.7–4.0)
MCH: 29.9 pg (ref 26.0–34.0)
MCHC: 33.7 g/dL (ref 30.0–36.0)
MCV: 88.7 fL (ref 80.0–100.0)
Monocytes Absolute: 0.5 10*3/uL (ref 0.1–1.0)
Monocytes Relative: 6 %
Neutro Abs: 5.3 10*3/uL (ref 1.7–7.7)
Neutrophils Relative %: 64 %
Platelets: 255 10*3/uL (ref 150–400)
RBC: 3.91 MIL/uL (ref 3.87–5.11)
RDW: 12.2 % (ref 11.5–15.5)
WBC: 8.4 10*3/uL (ref 4.0–10.5)
nRBC: 0 % (ref 0.0–0.2)

## 2020-03-28 LAB — URINALYSIS, COMPLETE (UACMP) WITH MICROSCOPIC
Bilirubin Urine: NEGATIVE
Glucose, UA: NEGATIVE mg/dL
Ketones, ur: 5 mg/dL — AB
Leukocytes,Ua: NEGATIVE
Nitrite: NEGATIVE
Protein, ur: NEGATIVE mg/dL
Specific Gravity, Urine: 1.021 (ref 1.005–1.030)
pH: 6 (ref 5.0–8.0)

## 2020-03-28 LAB — ABO/RH: ABO/RH(D): O POS

## 2020-03-28 LAB — HCG, QUANTITATIVE, PREGNANCY: hCG, Beta Chain, Quant, S: 13192 m[IU]/mL — ABNORMAL HIGH (ref <5)

## 2020-03-28 MED ORDER — CEPHALEXIN 500 MG PO CAPS: 500.0000 mg | ORAL_CAPSULE | Freq: Three times a day (TID) | ORAL | 0 refills | Status: AC

## 2020-03-28 NOTE — ED Provider Notes (Signed)
[facility]-EMERGENCY DEPARTMENT  ____________________________________________  Time seen: Approximately 10:21 PM  I have reviewed the triage vital signs and the nursing notes.   HISTORY  Chief Complaint Vaginal Bleeding ([redacted] weeks pregnant )   Historian Patient     HPI Sharon Houston is a 25 y.o. female G4, P1, presents to the emergency department with vaginal bleeding in the first trimester.  Patient is approximately [redacted] weeks pregnant.  She states that she had spotting which appeared as dark blood.  She states that she has had some mild cramping.  No low back pain, nausea, vomiting or fever.  She denies dysuria or increased urinary frequency.  Patient has had 2 prior miscarriages and is concerned.  She denies chest pain, chest tightness and shortness of breath.   No past medical history on file.   Immunizations up to date:  Yes.     No past medical history on file.  There are no problems to display for this patient.   No past surgical history on file.  Prior to Admission medications   Medication Sig Start Date End Date Taking? Authorizing Provider  cephALEXin (KEFLEX) 500 MG capsule Take 1 capsule (500 mg total) by mouth 3 (three) times daily for 7 days. 03/28/20 04/04/20 Yes Pia Mau M, PA-C  acetaminophen (TYLENOL) 500 MG tablet Take 1,000 mg by mouth every 6 (six) hours as needed for pain.    [provider]  ondansetron (ZOFRAN ODT) 4 MG disintegrating tablet Take 1 tablet (4 mg total) by mouth every 8 (eight) hours as needed for nausea. 02/23/12   Santiago Glad, PA-C    Allergies Patient has no known allergies.  No family history on file.  Social History Social History   Tobacco Use  . Smoking status: Never Smoker  . Smokeless tobacco: Never Used  Substance Use Topics  . Alcohol use: Yes  . Drug use: No     Review of Systems  Constitutional: No fever/chills Eyes:  No discharge ENT: No upper respiratory complaints. Respiratory: no cough.  No SOB/ use of accessory muscles to breath Gastrointestinal:   No nausea, no vomiting.  No diarrhea.  No constipation. Genitourinary: Patient has vaginal bleeding in pregnancy.  Musculoskeletal: Negative for musculoskeletal pain. Skin: Negative for rash, abrasions, lacerations, ecchymosis.  ____________________________________________   PHYSICAL EXAM:  VITAL SIGNS: ED Triage Vitals  Enc Vitals Group     BP 03/28/20 1830 119/74     Pulse Rate 03/28/20 1830 74     Resp 03/28/20 1830 16     Temp 03/28/20 1830 98.6 F (37 C)     Temp src --      SpO2 03/28/20 1830 100 %     Weight 03/28/20 1831 165 lb (74.8 kg)     Height 03/28/20 1831 5\' 6"  (1.676 m)     Head Circumference --      Peak Flow --      Pain Score 03/28/20 1830 6     Pain Loc --      Pain Edu? --      Excl. in GC? --      Constitutional: Alert and oriented. Well appearing and in no acute distress. Eyes: Conjunctivae are normal. PERRL. EOMI. Head: Atraumatic. ENT: Cardiovascular: Normal rate, regular rhythm. Normal S1 and S2.  Good peripheral circulation. Respiratory: Normal respiratory effort without tachypnea or retractions. Lungs CTAB. Good air entry to the bases with no decreased or absent breath sounds Gastrointestinal: Bowel sounds x 4 quadrants. Soft and nontender to  palpation. No guarding or rigidity. No distention. Musculoskeletal: Full range of motion to all extremities. No obvious deformities noted Neurologic:  Normal for age. No gross focal neurologic deficits are appreciated.  Skin:  Skin is warm, dry and intact. No rash noted. Psychiatric: Mood and affect are normal for age. Speech and behavior are normal.   ____________________________________________   LABS (all labs ordered are listed, but only abnormal results are displayed)  Labs Reviewed  CBC WITH DIFFERENTIAL/PLATELET - Abnormal; Notable for the following components:      Result Value   Hemoglobin 11.7 (*)    HCT 34.7 (*)    All other  components within normal limits  HCG, QUANTITATIVE, PREGNANCY - Abnormal; Notable for the following components:   hCG, Beta Chain, Quant, S 13,192 (*)    All other components within normal limits  URINALYSIS, COMPLETE (UACMP) WITH MICROSCOPIC - Abnormal; Notable for the following components:   Color, Urine YELLOW (*)    APPearance CLEAR (*)    Hgb urine dipstick MODERATE (*)    Ketones, ur 5 (*)    Bacteria, UA RARE (*)    All other components within normal limits  BASIC METABOLIC PANEL  POC URINE PREG, ED  ABO/RH   ____________________________________________  EKG   ____________________________________________  RADIOLOGY Geraldo Pitter, personally viewed and evaluated these images (plain radiographs) as part of my medical decision making, as well as reviewing the written report by the radiologist.  US OB LESS THAN 14 WEEKS WITH OB TRANSVAGINAL  Result Date: 03/28/2020 CLINICAL DATA:  Bleeding EXAM: OBSTETRIC <14 WK [place] AND TRANSVAGINAL OB US TECHNIQUE: Both transabdominal and transvaginal ultrasound examinations were performed for complete evaluation of the gestation as well as the maternal uterus, adnexal regions, and pelvic cul-de-sac. Transvaginal technique was performed to assess early pregnancy. COMPARISON:  None. FINDINGS: Intrauterine gestational sac: Single Yolk sac:  Visualized. Embryo:  Visualized. Cardiac Activity: Visualized. Heart Rate: Unable to determine heart rate by M mode. Heart rate too slow. MSD:   mm    w     d CRL:  5.2 mm   6 w   2 d                  [place] EDC: 11/19/2020 Subchorionic hemorrhage:  None visualized. Maternal uterus/adnexae: No adnexal mass or free fluid. IMPRESSION: Six week 2 day intrauterine pregnancy. Fetal heart tones seen visually, but unable to measure by M mode. Fetal bradycardia. This may be related to early gestational age. This could be followed with repeat ultrasound in 14 days to ensure expected progression Electronically Signed   By: Charlett Nose M.D.   On: 03/28/2020 21:19    ____________________________________________    PROCEDURES  Procedure(s) performed:     Procedures     Medications - No data to display   ____________________________________________   INITIAL IMPRESSION / ASSESSMENT AND PLAN / ED COURSE  Pertinent labs & imaging results that were available during my care of the patient were reviewed by me and considered in my medical decision making (see chart for details).       Assessment and plan Vaginal bleeding in pregnancy 25 year old female presents to the emergency department with vaginal bleeding in the first trimester.  Vital signs were reassuring at triage.  On physical exam, patient was alert, active and nontoxic-appearing with no abdominal tenderness on exam.  Beta-hCG was appropriately elevated in the emergency department.  H&H were reassuring.  Urinalysis shows a moderate amount of blood  with rare bacteria.  Dedicated OB ultrasound shows single viable intrauterine pregnancy with fetal heart rate not quantified.  Radiologist did express concern for fetal bradycardia and recommended repeat ultrasound in 2 weeks.  I informed patient of results and patient stated that she had an ultrasound scheduled in 1 week.  Return precautions were given to return with new or worsening symptoms.  All patient questions were answered.    ____________________________________________  FINAL CLINICAL IMPRESSION(S) / ED DIAGNOSES  Final diagnoses:  Vaginal bleeding in pregnancy      NEW MEDICATIONS STARTED DURING THIS VISIT:  ED Discharge Orders         Ordered    cephALEXin (KEFLEX) 500 MG capsule  3 times daily        03/28/20 2212              This chart was dictated using voice recognition software/Dragon. Despite best efforts to proofread, errors can occur which can change the meaning. Any change was purely unintentional.     Orvil Feil, PA-C 03/28/20 2224     Gilles Chiquito, MD 03/29/20 [phone]

## 2020-03-28 NOTE — ED Triage Notes (Signed)
Pt states she is [redacted] weeks pregnant and started to have some brown spotting and cramping.

## 2020-03-28 NOTE — Discharge Instructions (Addendum)
Please keep appointment with Ob/Gyn.  Please have repeat ultrasound in two weeks.

## 2020-03-31 NOTE — H&P (Signed)
[name] is a 25 y.o. female here for threatened abortion.  Patient is 9+2 weeks by sure LMP EDC 10/31/20, unplanned but desired pregnancy She complains of dark red spotting starting on 3/19, since then her bleeding has been dark red/brown with small stringy clots.  Cramping: yes , mild like menstrual cramps.  Tissue passage: no   Blood type is O POS  Hcg: 03/28/20- 13192  03/28/20: OB TVUS at [place]: Six week 2 day intrauterine pregnancy. Fetal heart tones seen visually, but unable to measure by M mode. Fetal bradycardia. This may be related to early gestational age     Past Medical History:  has no past medical history on file.  Past Surgical History:  has no past surgical history on file. Family History: family history includes Diabetes in her maternal grandfather; Heart disease in her maternal grandfather; High blood pressure (Hypertension) in her maternal grandfather. Social History:  reports that she has never smoked. She has never used smokeless tobacco. She reports current alcohol use. She reports that she does not use drugs. OB/GYN History:          OB History    Gravida  4   Para  1   Term  1   Preterm      AB  3   Living  1     SAB  3   IAB      Ectopic      Molar      Multiple      Live Births  1         Allergies: has No Known Allergies. Medications:  Current Outpatient Medications:  .  azithromycin (ZITHROMAX) 250 MG tablet, Take 2 tablets (564m) by mouth on Day 1. Take 1 tablet (2550m by mouth on Days 2-5. (Patient not taking: Reported on 03/30/2020  ), Disp: 6 tablet, Rfl: 0 .  cephalexin (KEFLEX) 500 MG capsule, Take 500 mg by mouth 3 (three) times daily (Patient not taking: Reported on 03/30/2020  ), Disp: , Rfl:  .  DM/PSEUDOEPHED/ACETAMINOPHEN (VICKS DAYQUIL ORAL), Take by mouth. (Patient not taking: Reported on 03/30/2020  ), Disp: , Rfl:  .  doxylamin-PSE-DM-acetaminophen (NYQUIL D) 6.25-30-15-500 mg/15 mL Liqd,  Take by mouth. (Patient not taking: Reported on 03/30/2020  ), Disp: , Rfl:  .  fluticasone (FLONASE) 50 mcg/actuation nasal spray, Place 2 sprays into both nostrils once daily. (Patient not taking: Reported on 03/30/2020  ), Disp: 16 g, Rfl: 0 .  hydrocodone-homatropine (HYCODAN) 5-1.5 mg/5 mL syrup, Take 5 mLs by mouth every 6 (six) hours as needed for Cough. (Patient not taking: Reported on 03/30/2020  ), Disp: 120 mL, Rfl: 0 .  NORGESTIMATE-ETHINYL ESTRADIOL (ORTHO TRI-CYCLEN, 28, ORAL), Take by mouth. (Patient not taking: Reported on 03/30/2020  ), Disp: , Rfl:   Review of Systems: General:                      No fatigue or weight loss Eyes:                           No vision changes Ears:                            No hearing difficulty Respiratory:                No cough or shortness of breath Pulmonary:  No asthma or shortness of breath Cardiovascular:           No chest pain, palpitations, dyspnea on exertion Gastrointestinal:          No abdominal bloating, chronic diarrhea, constipations, masses, pain or hematochezia Genitourinary:             No hematuria, dysuria, abnormal vaginal discharge, pelvic pain, Menometrorrhagia Lymphatic:                   No swollen lymph nodes Musculoskeletal:         No muscle weakness Neurologic:                  No extremity weakness, syncope, seizure disorder Psychiatric:                  No history of depression, delusions or suicidal/homicidal ideation    Exam:      Vitals:   03/30/20 1634  BP: 112/60    Body mass index is 27.12 kg/m.  General: Well-developed, well-nourished female in no acute distress  CV: RRR Pulm: CTAB  Skin: No rashes, ulcers or skin lesions noted. Neurological: Appears alert and oriented and is a good historian. No gross abnormalities are noted. Psychological: Appropriate affect and mood. No signs of abnormal anxiety or depression noted. Pt is appropriately concerned.  Abdomen: Soft,  nontender, without hepatosplenomegaly or masses. No hernias. Pelvic: deferred   OB TVUS: Uterus retroverted Single, non viable IUP, S=69w2dYolk sac and amnion seen No FHRseen Cervical length=4.21cm Rt ovaryappears wnl Left ovaryappears wnl No free fluid seen   Impression:   The encounter diagnosis was Missed abortion.     Plan:   1. Threatened abortion: Vaginal bleeding is common in pregnancy, and is sometimes associated with miscarriage of an intrauterine pregnancy.   Discussed options for management of missed abortion. Pt with sure LMP and positive home UPT in Feb 2022. Slow cardiac activity [place] and [place] cardiac motion [place].   Discussed expectant, medical and surgical mgmt, pt desires surgical mgmt and further workup due to 3rd miscarriage.   Consulted with Dr BLeafy Ro who met and discussed R/B/A of suction D&C with pt. Procedure and blood consents signed.

## 2020-04-01 ENCOUNTER — Encounter
Admission: RE | Disposition: A | Discharge status: Home / Self Care | Payer: Self-pay | Source: Home / Self Care | Attending: Obstetrics and Gynecology

## 2020-04-01 ENCOUNTER — Other Ambulatory Visit
Admission: RE | Admit: 2020-04-01 | Discharge: 2020-04-01 | Disposition: A | Discharge status: Home / Self Care | Payer: Medicaid Other | Source: Ambulatory Visit | Attending: Obstetrics and Gynecology | Admitting: Obstetrics and Gynecology

## 2020-04-01 ENCOUNTER — Ambulatory Visit: Payer: Medicaid Other | Admitting: Registered Nurse

## 2020-04-01 ENCOUNTER — Encounter: Payer: Self-pay | Admitting: Obstetrics and Gynecology

## 2020-04-01 ENCOUNTER — Ambulatory Visit
Admission: RE | Admit: 2020-04-01 | Discharge: 2020-04-01 | Disposition: A | Discharge status: Home / Self Care | Payer: Medicaid Other | Attending: Obstetrics and Gynecology | Admitting: Obstetrics and Gynecology

## 2020-04-01 ENCOUNTER — Other Ambulatory Visit: Payer: Self-pay

## 2020-04-01 DIAGNOSIS — Z8249 Family history of ischemic heart disease and other diseases of the circulatory system: Secondary | ICD-10-CM | POA: Diagnosis not present

## 2020-04-01 DIAGNOSIS — Z833 Family history of diabetes mellitus: Secondary | ICD-10-CM | POA: Diagnosis not present

## 2020-04-01 DIAGNOSIS — Z79899 Other long term (current) drug therapy: Secondary | ICD-10-CM | POA: Diagnosis not present

## 2020-04-01 DIAGNOSIS — Z3A09 9 weeks gestation of pregnancy: Secondary | ICD-10-CM | POA: Insufficient documentation

## 2020-04-01 DIAGNOSIS — O368310 Maternal care for abnormalities of the fetal heart rate or rhythm, first trimester, not applicable or unspecified: Secondary | ICD-10-CM | POA: Diagnosis not present

## 2020-04-01 DIAGNOSIS — O021 Missed abortion: Secondary | ICD-10-CM | POA: Insufficient documentation

## 2020-04-01 DIAGNOSIS — Z20822 Contact with and (suspected) exposure to covid-19: Secondary | ICD-10-CM | POA: Insufficient documentation

## 2020-04-01 HISTORY — PX: DILATION AND EVACUATION: SHX1459

## 2020-04-01 LAB — BASIC METABOLIC PANEL
Anion gap: 7 (ref 5–15)
BUN: 16 mg/dL (ref 6–20)
CO2: 26 mmol/L (ref 22–32)
Calcium: 9.1 mg/dL (ref 8.9–10.3)
Chloride: 103 mmol/L (ref 98–111)
Creatinine, Ser: 0.5 mg/dL (ref 0.44–1.00)
GFR, Estimated: >60 mL/min (ref >60)
Glucose, Bld: 99 mg/dL (ref 70–99)
Potassium: 4.1 mmol/L (ref 3.5–5.1)
Sodium: 136 mmol/L (ref 135–145)

## 2020-04-01 LAB — CBC
HCT: 33.9 % — ABNORMAL LOW (ref 36.0–46.0)
Hemoglobin: 11.4 g/dL — ABNORMAL LOW (ref 12.0–15.0)
MCH: 29.8 pg (ref 26.0–34.0)
MCHC: 33.6 g/dL (ref 30.0–36.0)
MCV: 88.5 fL (ref 80.0–100.0)
Platelets: 222 10*3/uL (ref 150–400)
RBC: 3.83 MIL/uL — ABNORMAL LOW (ref 3.87–5.11)
RDW: 12.3 % (ref 11.5–15.5)
WBC: 6.3 10*3/uL (ref 4.0–10.5)
nRBC: 0 % (ref 0.0–0.2)

## 2020-04-01 LAB — SARS CORONAVIRUS 2 BY RT PCR (HOSPITAL ORDER, PERFORMED IN ~~LOC~~ HOSPITAL LAB): SARS Coronavirus 2: NEGATIVE

## 2020-04-01 LAB — TYPE AND SCREEN
ABO/RH(D): O POS
Antibody Screen: NEGATIVE

## 2020-04-01 SURGERY — DILATION AND EVACUATION, UTERUS
Anesthesia: General | Site: Vagina

## 2020-04-01 MED ORDER — SILVER NITRATE-POT NITRATE 75-25 % EX MISC
CUTANEOUS | Status: DC | PRN
  Administered 2020-04-01: 2

## 2020-04-01 MED ORDER — FENTANYL CITRATE (PF) 100 MCG/2ML IJ SOLN
INTRAMUSCULAR | Status: DC | PRN
  Administered 2020-04-01: 50 ug via INTRAVENOUS

## 2020-04-01 MED ORDER — CHLORHEXIDINE GLUCONATE 0.12 % MT SOLN: 15.0000 mL | Freq: Once | OROMUCOSAL | Status: AC

## 2020-04-01 MED ORDER — SCOPOLAMINE 1 MG/3DAYS TD PT72
MEDICATED_PATCH | TRANSDERMAL | Status: AC
  Filled 2020-04-01: qty 1

## 2020-04-01 MED ORDER — ONDANSETRON HCL 4 MG/2ML IJ SOLN
INTRAMUSCULAR | Status: AC
  Filled 2020-04-01: qty 2

## 2020-04-01 MED ORDER — MIDAZOLAM HCL 2 MG/2ML IJ SOLN
INTRAMUSCULAR | Status: DC | PRN
  Administered 2020-04-01: 2 mg via INTRAVENOUS

## 2020-04-01 MED ORDER — DOXYCYCLINE HYCLATE 100 MG PO TABS
200.0000 mg | ORAL_TABLET | Freq: Once | ORAL | Status: AC
  Administered 2020-04-01: 200 mg via ORAL
  Filled 2020-04-01: qty 2

## 2020-04-01 MED ORDER — LIDOCAINE HCL (CARDIAC) PF 100 MG/5ML IV SOSY
PREFILLED_SYRINGE | INTRAVENOUS | Status: DC | PRN
  Administered 2020-04-01: 80 mg via INTRAVENOUS

## 2020-04-01 MED ORDER — KETOROLAC TROMETHAMINE 30 MG/ML IJ SOLN
INTRAMUSCULAR | Status: AC
  Filled 2020-04-01: qty 1

## 2020-04-01 MED ORDER — KETOROLAC TROMETHAMINE 30 MG/ML IJ SOLN
INTRAMUSCULAR | Status: DC | PRN
  Administered 2020-04-01: 30 mg via INTRAVENOUS

## 2020-04-01 MED ORDER — OXYCODONE HCL 5 MG PO TABS: 5.0000 mg | ORAL_TABLET | Freq: Once | ORAL | Status: DC | PRN

## 2020-04-01 MED ORDER — ALBUTEROL SULFATE HFA 108 (90 BASE) MCG/ACT IN AERS
INHALATION_SPRAY | RESPIRATORY_TRACT | Status: AC
  Filled 2020-04-01: qty 6.7

## 2020-04-01 MED ORDER — METHYLERGONOVINE MALEATE 0.2 MG/ML IJ SOLN
INTRAMUSCULAR | Status: AC
  Filled 2020-04-01: qty 1

## 2020-04-01 MED ORDER — PROPOFOL 10 MG/ML IV BOLUS
INTRAVENOUS | Status: AC
  Filled 2020-04-01: qty 20

## 2020-04-01 MED ORDER — ORAL CARE MOUTH RINSE: 15.0000 mL | Freq: Once | OROMUCOSAL | Status: AC

## 2020-04-01 MED ORDER — MIDAZOLAM HCL 2 MG/2ML IJ SOLN
INTRAMUSCULAR | Status: AC
  Filled 2020-04-01: qty 2

## 2020-04-01 MED ORDER — OXYCODONE HCL 5 MG/5ML PO SOLN
5.0000 mg | Freq: Once | ORAL | Status: DC | PRN
Start: 2020-04-01 — End: 2020-04-01

## 2020-04-01 MED ORDER — DEXAMETHASONE SODIUM PHOSPHATE 10 MG/ML IJ SOLN
INTRAMUSCULAR | Status: AC
  Filled 2020-04-01: qty 1

## 2020-04-01 MED ORDER — ONDANSETRON HCL 4 MG/2ML IJ SOLN
INTRAMUSCULAR | Status: DC | PRN
  Administered 2020-04-01: 4 mg via INTRAVENOUS

## 2020-04-01 MED ORDER — LACTATED RINGERS IV SOLN: INTRAVENOUS | Status: DC

## 2020-04-01 MED ORDER — POVIDONE-IODINE 10 % EX SWAB: 2.0000 "application " | Freq: Once | CUTANEOUS | Status: DC

## 2020-04-01 MED ORDER — PROPOFOL 10 MG/ML IV BOLUS
INTRAVENOUS | Status: DC | PRN
  Administered 2020-04-01: 50 mg via INTRAVENOUS
  Administered 2020-04-01: 150 mg via INTRAVENOUS

## 2020-04-01 MED ORDER — DEXAMETHASONE SODIUM PHOSPHATE 10 MG/ML IJ SOLN
INTRAMUSCULAR | Status: DC | PRN
  Administered 2020-04-01: 10 mg via INTRAVENOUS

## 2020-04-01 MED ORDER — FENTANYL CITRATE (PF) 100 MCG/2ML IJ SOLN
25.0000 ug | INTRAMUSCULAR | Status: DC | PRN
Start: 2020-04-01 — End: 2020-04-01

## 2020-04-01 MED ORDER — SILVER NITRATE-POT NITRATE 75-25 % EX MISC
CUTANEOUS | Status: AC
  Filled 2020-04-01: qty 10

## 2020-04-01 MED ORDER — CHLORHEXIDINE GLUCONATE 0.12 % MT SOLN
OROMUCOSAL | Status: AC
  Administered 2020-04-01: 15 mL via OROMUCOSAL
  Filled 2020-04-01: qty 15

## 2020-04-01 MED ORDER — PROMETHAZINE HCL 25 MG/ML IJ SOLN: 6.2500 mg | INTRAMUSCULAR | Status: DC | PRN

## 2020-04-01 MED ORDER — FENTANYL CITRATE (PF) 100 MCG/2ML IJ SOLN
INTRAMUSCULAR | Status: AC
  Filled 2020-04-01: qty 2

## 2020-04-01 SURGICAL SUPPLY — 22 items
CATH ROBINSON RED A/P 16FR (CATHETERS) ×2 IMPLANT
COVER WAND RF STERILE (DRAPES) ×2 IMPLANT
FILTER UTR ASPR SPEC (MISCELLANEOUS) ×1 IMPLANT
FLTR UTR ASPR SPEC (MISCELLANEOUS) ×2
GLOVE SURG ENC MOIS LTX SZ7 (GLOVE) ×2 IMPLANT
GLOVE SURG UNDER LTX SZ7.5 (GLOVE) ×2 IMPLANT
GOWN STRL REUS W/ TWL LRG LVL3 (GOWN DISPOSABLE) ×2 IMPLANT
GOWN STRL REUS W/TWL LRG LVL3 (GOWN DISPOSABLE) ×4
KIT BERKELEY 1ST TRIMESTER 3/8 (MISCELLANEOUS) ×2 IMPLANT
KIT TURNOVER CYSTO (KITS) ×2 IMPLANT
MANIFOLD NEPTUNE II (INSTRUMENTS) ×2 IMPLANT
PACK DNC HYST (MISCELLANEOUS) ×2 IMPLANT
PAD OB MATERNITY 4.3X12.25 (PERSONAL CARE ITEMS) ×2 IMPLANT
PAD PREP 24X41 OB/GYN DISP (PERSONAL CARE ITEMS) ×2 IMPLANT
SET BERKELEY SUCTION TUBING (SUCTIONS) ×2 IMPLANT
TOWEL OR 17X26 4PK STRL BLUE (TOWEL DISPOSABLE) ×2 IMPLANT
VACURETTE 10 RIGID CVD (CANNULA) IMPLANT
VACURETTE 6 ASPIR F TIP BERK (CANNULA) ×2 IMPLANT
VACURETTE 7MM F TIP (CANNULA)
VACURETTE 7MM F TIP STRL (CANNULA) IMPLANT
VACURETTE 8 RIGID CVD (CANNULA) IMPLANT
VACURETTE 8MM F TIP (MISCELLANEOUS) ×2 IMPLANT

## 2020-04-01 NOTE — Anesthesia Preprocedure Evaluation (Signed)
Anesthesia Evaluation  Patient identified by MRN, date of birth, ID band Patient awake    Reviewed: Allergy & Precautions, H&P , NPO status , Patient's Chart, lab work & pertinent test results  History of Anesthesia Complications Negative for: history of anesthetic complications  Airway Mallampati: II  TM Distance: >3 FB     Dental  (+) Teeth Intact   Pulmonary neg pulmonary ROS, neg sleep apnea, neg COPD,    breath sounds clear to auscultation       Cardiovascular (-) angina(-) Past MI and (-) Cardiac Stents negative cardio ROS  (-) dysrhythmias  Rhythm:regular Rate:Normal     Neuro/Psych negative neurological ROS  negative psych ROS   GI/Hepatic negative GI ROS, Neg liver ROS,   Endo/Other  negative endocrine ROS  Renal/GU      Musculoskeletal   Abdominal   Peds  Hematology negative hematology ROS (+)   Anesthesia Other Findings History reviewed. No pertinent past medical history.  History reviewed. No pertinent surgical history.     Reproductive/Obstetrics negative OB ROS                             Anesthesia Physical Anesthesia Plan  ASA: I  Anesthesia Plan: General LMA   Post-op Pain Management:    Induction:   PONV Risk Score and Plan: Ondansetron, Dexamethasone, Midazolam, Treatment may vary due to age or medical condition and Scopolamine patch - Pre-op  Airway Management Planned:   Additional Equipment:   Intra-op Plan:   Post-operative Plan:   Informed Consent: I have reviewed the patients History and Physical, chart, labs and discussed the procedure including the risks, benefits and alternatives for the proposed anesthesia with the patient or authorized representative who has indicated his/her understanding and acceptance.     Dental Advisory Given  Plan Discussed with: Anesthesiologist, CRNA and Surgeon  Anesthesia Plan Comments:          Anesthesia Quick Evaluation

## 2020-04-01 NOTE — Anesthesia Procedure Notes (Signed)
Procedure Name: LMA Insertion Date/Time: 04/01/2020 1:37 PM Performed by: Malva Cogan, CRNA Pre-anesthesia Checklist: Patient identified, Patient being monitored, Timeout performed, Emergency Drugs available and Suction available Patient Re-evaluated:Patient Re-evaluated prior to induction Oxygen Delivery Method: Circle system utilized Preoxygenation: Pre-oxygenation with 100% oxygen Induction Type: IV induction Ventilation: Mask ventilation without difficulty LMA: LMA inserted LMA Size: 3.5 Tube type: Oral Number of attempts: 1 Placement Confirmation: positive ETCO2 and breath sounds checked- equal and bilateral Tube secured with: Tape Dental Injury: Teeth and Oropharynx as per pre-operative assessment

## 2020-04-01 NOTE — Interval H&P Note (Signed)
History and Physical Interval Note:  04/01/2020 1:26 PM  Arlin Savona  has presented today for surgery, with the diagnosis of missed abortion.  The various methods of treatment have been discussed with the patient and family. After consideration of risks, benefits and other options for treatment, the patient has consented to  Procedure(s): DILATATION AND EVACUATION (N/A) as a surgical intervention.  The patient's history has been reviewed, patient examined, no change in status, stable for surgery.  I have reviewed the patient's chart and labs.  Questions were answered to the patient's satisfaction.     Christeen Douglas

## 2020-04-01 NOTE — Discharge Instructions (Signed)
AMBULATORY SURGERY  °DISCHARGE INSTRUCTIONS ° ° °1) The drugs that you were given will stay in your system until tomorrow so for the next 24 hours you should not: ° °A) Drive an automobile °B) Make any legal decisions °C) Drink any alcoholic beverage ° ° °2) You may resume regular meals tomorrow.  Today it is better to start with liquids and gradually work up to solid foods. ° °You may eat anything you prefer, but it is better to start with liquids, then soup and crackers, and gradually work up to solid foods. ° ° °3) Please notify your doctor immediately if you have any unusual bleeding, trouble breathing, redness and pain at the surgery site, drainage, fever, or pain not relieved by medication. ° ° ° °4) Additional Instructions: ° ° ° ° ° ° ° °Please contact your physician with any problems or Same Day Surgery at [phone], Monday through Friday 6 am to 4 pm, or [place] [facility] number at [phone]. ° ° °Discharge instructions after a Dilation and Curettage ° °Signs and Symptoms to Report ° °Call our office at [phone] if you have any of the following:  ° °• Fever over 100.4 degrees or higher °• Severe stomach pain not relieved with pain medications °• Bright red bleeding that’s heavier than a period that does not slow with rest after the first 24 hours °• To go the bathroom a lot (frequency), you can’t hold your urine (urgency), or it hurts when you empty your bladder (urinate) °• Chest pain °• Shortness of breath °• Pain in the calves of your legs °• Severe nausea and vomiting not relieved with anti-nausea medications °• Any concerns ° °What You Can Expect after Surgery °• You may see some pink tinged, bloody fluid. This is normal. You may also have cramping for several days.  ° °Activities after Your Discharge °Follow these guidelines to help speed your recovery at home: °• Don’t drive if you are in pain or taking narcotic pain medicine. You may drive when you can safely slam on the  brakes, turn the wheel forcefully, and rotate your torso comfortably. This is typically 4-7 days. Practice in a parking lot or side street prior to attempting to drive regularly.  °• Ask others to help with household chores until you feel up to doing tasks. °• Don’t do strenuous activities, exercises, or sports like vacuuming, tennis, squash, etc. until your doctor says it is safe to do so. °• Walk as you feel able. Rest often since it may take a week or two for your energy level to return to normal.  °• You may climb stairs °• Avoid constipation: °  -Eat fruits, vegetables, and whole grains. Eat small meals as your appetite will take time to return to normal. °  -Drink 6 to 8 glasses of water each day unless your doctor has told you to limit your fluids. °  -Use a laxative or stool softener as needed if constipation becomes a problem. You may take Miralax, metamucil, Citrucil, Colace, Senekot, FiberCon, etc. If this does not relieve the constipation, try two tablespoons of Milk Of Magnesia every 8 hours until your bowels move.  °• You may shower.  °• Do not get in a hot tub, swimming pool, etc. until your doctor agrees. °• Do not douche, use tampons, or have sex until you stop spotting, usually about 2 weeks. °• Take your pain medicine when you need it. The medicine may not work as well if the   pain is bad. ° °Take the medicines you were taking before surgery. Other medications you might need are pain medications (ibuprofen), medications for constipation (Colace) and nausea medications (Zofran).  ° ° ° ° ° °Coping with Pregnancy Loss °Pregnancy loss can happen any time during a pregnancy. Often the cause is not known. It is rarely because of anything you did. Pregnancy loss in early pregnancy (during the first trimester) is called a miscarriage. This type of pregnancy loss is the most common.  ° °Any pregnancy loss can be devastating. You will need to recover both physically and emotionally. Most women are able to  get pregnant again after a pregnancy loss and deliver a healthy baby. ° °How to manage emotional recovery ° °Pregnancy loss is very hard emotionally. You may feel many different emotions while you grieve. You may feel sad and angry. You may also feel guilty. It is normal to have periods of crying. Emotional recovery can take longer than physical recovery. It is different for everyone. Some women may feel back to normal quickly and others take longer. °Taking these steps can help you cope: °· Remember that it is unlikely you did anything to cause the pregnancy loss. °· Share your thoughts and feelings with friends, family, and your partner. Remember that your partner is also recovering emotionally. °· Make sure you have a good support system, and do not spend too much time alone. °· Meet with a pregnancy loss counselor or join a pregnancy loss support group. °· Get enough sleep and eat a healthy diet. Return to regular exercise when you have recovered physically. °· Do not use drugs or alcohol to manage your emotions. °· Consider seeing a mental health professional to help you recover emotionally. °· Ask a friend or loved one to help you decide what to do with any clothing and nursery items you received for your baby. ° °How to recognize emotional stress °It is normal to have emotional stress after a pregnancy loss. But emotional stress that lasts a long time or becomes severe requires treatment. Watch out for these signs of severe emotional stress: °· Sadness, anger, or guilt that is not going away and is interfering with your normal activities. °· Relationship problems that have occurred or gotten worse since the pregnancy loss. °· Signs of depression that last longer than 2 weeks. These may include: °? Sadness. °? Anxiety. °? Hopelessness. °? Loss of interest in activities you enjoy. °? Inability to concentrate. °? Trouble sleeping or sleeping too much. °? Loss of appetite or overeating. °? Thoughts of death or of  hurting yourself. °Follow these instructions at home: °Medicines °· Take over-the-counter and prescription medicines only as told by your health care provider. °Activity °· Rest at home until your energy level returns. Return to your normal activities as told by your health care provider. Ask your health care provider what activities are safe for you. °General instructions °· Keep all follow-up visits as told by your health care provider. This is important. °· It may be helpful to meet with others who have experienced pregnancy loss. Ask your health care provider about support groups and resources. °· To help you and your partner with the process of grieving, talk with your health care provider or seek counseling. °· When you are ready, meet with your health care provider to discuss steps to take for a future pregnancy. °Where to find more information °· [place] Department of Health and Human Services Office on Women's Health: www.womenshealth.gov °· American   Pregnancy Association: www.americanpregnancy.org °Contact a health care provider if: °· You continue to experience grief, sadness, or lack of motivation for everyday activities, and those feelings do not improve over time. °· You are struggling to recover emotionally, especially if you are using alcohol or substances to help. °Get help right away if: °· You have thoughts of hurting yourself or others. °If you ever feel like you may hurt yourself or others, or have thoughts about taking your own life, get help right away. You can go to your nearest emergency department or call: °· Your local emergency services (911 in the [place] °· A suicide crisis helpline, such as the National Suicide Prevention Lifeline at 1-800-273-8255. This is open 24 hours a day. °Summary °· Any pregnancy loss can be difficult physically and emotionally. °· You may experience many different emotions while you grieve. Emotional recovery can last longer than physical recovery. °· It is normal  to have emotional stress after a pregnancy loss. But emotional stress that lasts a long time or becomes severe requires treatment. °· See your health care provider if you are struggling emotionally after a pregnancy loss. °This information is not intended to replace advice given to you by your health care provider. Make sure you discuss any questions you have with your health care provider. °Document Released: 03/09/2017 Document Revised: 03/09/2017 Document Reviewed: 03/09/2017 °Elsevier Interactive Patient Education © 2019 [employer] ° ° °

## 2020-04-01 NOTE — Op Note (Signed)
Operative Report Suction Dilation and Curettage   Indications: Postpartum bleeding  Pre-operative Diagnosis:  Spontaneous abortion Post-operative Diagnosis: same.  Procedure: 1. Suction D&C  Surgeon: Christeen Douglas, MD  Assistant(s):  None  Anesthesia: General LMA anesthesia  Anesthesiologist: Yves Dill, MD Anesthesiologist: Yves Dill, MD; Karleen Hampshire, MD CRNA: Malva Cogan, CRNA  Estimated Blood Loss:  150         Intraoperative medications: 30mg  IV Toradol         Total IV Fluids:  Urine Output: 68ml         Specimens: Products of conception; SNP microarray          Complications:  None; patient tolerated the procedure well.         Disposition: PACU - hemodynamically stable.         Condition: stable  Findings: Uterus measuring 6 weeks retroverted; normal cervix, vagina, perineum.   Indication for procedure/Consents: 25 y.o. F 30 at approx 6w with SAB,  here for scheduled surgery for treatment of postpartum bleeding with suspected retained products of conception.  Risks of surgery were discussed with the patient including but not limited to: bleeding which may require transfusion; infection which may require antibiotics; injury to uterus or surrounding organs; intrauterine scarring which may impair future fertility; need for additional procedures including laparotomy or laparoscopy; and other postoperative/anesthesia complications. Written informed consent was obtained.    Procedure Details:   The patient received oral antibiotics while in the preoperative area.  She was then taken to the operating room where general anesthesia was administered and was found to be adequate.  After a formal and adequate timeout was performed, she was placed in the dorsal lithotomy position and examined with the above findings. She was then prepped and draped in the sterile manner.   Her bladder was catheterized for an estimated amount of clear, yellow  urine. A speculum was then placed in the patient's vagina and a single tooth tenaculum was applied to the anterior lip of the cervix.    No uterine sounding was performed on this pregnant uterus. Her cervix was serially dilated to accommodate a 6 sized flexible suction curette.  A gentle sharp curettage was then performed until there was a gritty texture in all four quadrants.  The tenaculum was removed from the anterior lip of the cervix and the vaginal speculum was removed after noting good hemostasis. The patient tolerated the procedure well and was taken to the recovery area awake, extubated and in stable condition.  The patient will be discharged to home as per PACU criteria.  She will receive another dose of oral antibiotics prior to discharge. Routine postoperative instructions given.  She was prescribed Percocet, Ibuprofen and Colace.  She will follow up in the clinic in two weeks for postoperative evaluation.

## 2020-04-01 NOTE — Transfer of Care (Signed)
Immediate Anesthesia Transfer of Care Note  Patient: Ohana Birdwell  Procedure(s) Performed: DILATATION AND EVACUATION (N/A Vagina )  Patient Location: PACU  Anesthesia Type:General  Level of Consciousness: drowsy  Airway & Oxygen Therapy: Patient Spontanous Breathing and Patient connected to face mask oxygen  Post-op Assessment: Report given to RN and Post -op Vital signs reviewed and stable  Post vital signs: Reviewed and stable  Last Vitals:  Vitals Value Taken Time  BP 109/74 04/01/20 1425  Temp    Pulse 72 04/01/20 1428  Resp 24 04/01/20 1428  SpO2 100 % 04/01/20 1428  Vitals shown include unvalidated device data.  Last Pain:  Vitals:   04/01/20 1113  TempSrc: Temporal  PainSc: 0-No pain         Complications: No complications documented.

## 2020-04-02 ENCOUNTER — Encounter: Payer: Self-pay | Admitting: Obstetrics and Gynecology

## 2020-04-03 LAB — SURGICAL PATHOLOGY

## 2020-04-06 NOTE — Anesthesia Postprocedure Evaluation (Signed)
Anesthesia Post Note  Patient: Sharon Houston  Procedure(s) Performed: DILATATION AND EVACUATION (N/A Vagina )  Patient location during evaluation: PACU Anesthesia Type: General Level of consciousness: awake and alert and oriented Pain management: pain level controlled Vital Signs Assessment: post-procedure vital signs reviewed and stable Respiratory status: spontaneous breathing Cardiovascular status: blood pressure returned to baseline Anesthetic complications: no   No complications documented.   Last Vitals:  Vitals:   04/01/20 1500 04/01/20 1513  BP: 117/77 (!) 123/45  Pulse: 62   Resp: 11 16  Temp:    SpO2: 100% 100%    Last Pain:  Vitals:   04/02/20 0857  TempSrc:   PainSc: 2                  CARROLL,PAUL

## 2020-04-21 LAB — POC/TISSUE MICROARRAY

## 2020-04-21 LAB — MISC LABCORP TEST (SEND OUT): Labcorp test code: 511402

## 2020-11-25 NOTE — H&P (Signed)
Chief Complaint:  Sharon Houston is a 25 y.o. female presenting with Pre Op Consulting (Sign consents)  on 11/25/2020  History of Present Illness: Pt returns today for preoperative exam prior to diagnostic lap for her pelvic pain, dysmenorrhea and endometriosis. She also has a history of recurrent miscarriage.  Today: no questions today   03/2020 D&C Pathology A.  PRODUCTS OF CONCEPTION; DILATATION AND CURETTAGE:  - CHORIONIC VILLI AND IMPLANTATION SITE, CONSISTENT WITH PRODUCTS OF CONCEPTION.  - DECIDUA WITH DEGENERATIVE CHANGES.    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 This trisomy is most often not  compatible with fetal viability. No admixture of maternal  and fetal DNA was noted in this microarray analysis.         No other DNA copy number changes or copy neutral ROH  were detected within the present reporting criteria. The  recurrence risk for any trisomy ascertained at birth may be  as high as 1% for women age 87 and under. The recurrence  risk is age related in women over 34 years  Pertinent Hx: - Hx of 3 SABs: 2 with current partner and 1 with her son's father.  - S/p 2 suction D&C's for missed ab (one in 2015, another 2022 at 6 weeks with me)    Past Medical History:  has no past medical history on file.  Past Surgical History:  has a past surgical history that includes dilation & currettage (03/28/2020). Family History: family history includes Diabetes in her maternal grandfather; Heart disease in her maternal grandfather; High blood pressure (Hypertension) in her maternal grandfather. Social History:  reports that she has never smoked. She has never used smokeless tobacco. She reports current alcohol use. She reports that she does not use drugs. OB/GYN History:  OB History     Gravida 4   Para 1   Term 1   Preterm 0   AB 3   Living 1    SAB 3   IAB 0   Ectopic 0   Molar 0   Multiple     Live Births 1      Allergies: has No Known Allergies. Medications:  Current Outpatient Medications:    azithromycin (ZITHROMAX) 250 MG tablet, Take 2 tablets (500mg ) by mouth on Day 1. Take 1 tablet (250mg ) by mouth on Days 2-5. (Patient not taking: Reported on 03/30/2020), Disp: 6 tablet, Rfl: 0   DM/PSEUDOEPHED/ACETAMINOPHEN (VICKS DAYQUIL ORAL), Take by mouth. (Patient not taking: Reported on 03/30/2020), Disp: , Rfl:    doxylamin-PSE-DM-acetaminophen 6.25-30-15-500 mg/15 mL Liqd, Take by mouth. (Patient not taking: Reported on 03/30/2020), Disp: , Rfl:    drospirenone-ethinyl estradioL (YAZ) 3-0.02 mg tablet, Take 1 tablet by mouth once daily (Patient not taking: Reported on 11/18/2020), Disp: 28 tablet, Rfl: 11   fluticasone (FLONASE) 50 mcg/actuation nasal spray, Place 2 sprays into both nostrils once daily. (Patient not taking: Reported on 03/30/2020), Disp: 16 g, Rfl: 0   hydrocodone-homatropine (HYCODAN) 5-1.5 mg/5 mL syrup, Take 5 mLs by mouth every 6 (six) hours as needed for Cough. (Patient not taking: Reported on 03/30/2020), Disp: 120 mL, Rfl: 0  Review of Systems: No SOB, no palpitations or chest pain, no new lower extremity edema, no nausea or vomiting or bowel or bladder complaints. See HPI for gyn specific ROS.   Exam:  BP 127/86   Pulse 80  Ht 170.2 cm (5\' 7" )   Wt 79.1 kg (174 lb 6.4 oz)   LMP 11/01/2020   BMI 27.31 kg/m  Ht: 5'6" Wt: 174.4 lbs, 127/82  General: Patient is well-groomed, well-nourished, appears stated age in no acute distress   HEENT: head is atraumatic and normocephalic, trachea is midline, neck is supple with no palpable nodules   CV: Regular rhythm and normal heart rate, no murmur   Pulm: Clear to auscultation throughout lung fields with no wheezing, crackles, or rhonchi. No increased work of  breathing  Abdomen: soft , no mass, non-tender, no rebound tenderness, no hepatomegaly  Pelvic: deferred   Impression:  The primary encounter diagnosis was Preop examination. Diagnoses of Dysmenorrhea, Pelvic pain in female, Dyspareunia, female, and History of recurrent miscarriages, not currently pregnant were also pertinent to this visit.  Plan:  - Pelvic pain, Dysmenorrhea, Dyspareunia, Endometriosis, recurrent spontaneous abortion:  Discussed with pt expectant, medical and surgical management of her pain. She has requested a dx lap.    Planned Procedure: Diagnostic Laparoscopy with chromotubation and possible excision of endometriosis and lysis of adhesions.  The patient and I discussed the technical aspects of the procedure including the potential for risks and complications.  These include but are not limited to the risk of infection requiring post-operative antibiotics or further procedures.  We talked about the risk of injury to adjacent organs including bladder, bowel, ureter, blood vessels or nerves.  We talked about the need to convert to an open incision.  We talked about the possible need for blood transfusion.  We talked about postop complications such as thromboembolic or cardiopulmonary complications.  All of her questions were answered.  Her preoperative exam was completed and the appropriate consents were signed. She is scheduled to undergo this procedure in the near future.  Specific Peri-operative Considerations:  - Consent: obtained today - Health Maintenance:  - Labs: CBC, CMP preoperatively - Studies: EKG, CXR preoperatively - Bowel Preparation: None required - Abx:  Cefoxitin 2g - VTE ppx: SCDs perioperatively    Return for Postop check.

## 2020-11-30 ENCOUNTER — Encounter
Admission: RE | Admit: 2020-11-30 | Discharge: 2020-11-30 | Disposition: A | Discharge status: Home / Self Care | Payer: Medicaid Other | Source: Ambulatory Visit | Attending: Obstetrics and Gynecology | Admitting: Obstetrics and Gynecology

## 2020-11-30 ENCOUNTER — Other Ambulatory Visit: Payer: Medicaid Other

## 2020-11-30 ENCOUNTER — Other Ambulatory Visit: Payer: Self-pay

## 2020-11-30 NOTE — Patient Instructions (Addendum)
Your procedure is scheduled on: Friday 12/11/20 Report to the Registration Desk on the 1st floor of the [facility]. To find out your arrival time, please call [phone] between 1PM - 3PM on: Thursday 12/10/20  REMEMBER: Instructions that are not followed completely may result in serious medical risk, up to and including death; or upon the discretion of your surgeon and anesthesiologist your surgery may need to be rescheduled.  Do not eat food after midnight the night before surgery.  No gum chewing, lozengers or hard candies.  You may however, drink CLEAR liquids up to 2 hours before you are scheduled to arrive for your surgery. Do not drink anything within 2 hours of your scheduled arrival time.  Clear liquids include: - water  - apple juice without pulp - gatorade (not RED, PURPLE, OR BLUE) - black coffee or tea (Do NOT add milk or creamers to the coffee or tea) Do NOT drink anything that is not on this list.  TAKE THESE MEDICATIONS THE MORNING OF SURGERY WITH A SIP OF WATER: NONE  One week prior to surgery: Stop Anti-inflammatories (NSAIDS) such as Advil, Aleve, Ibuprofen, Motrin, Naproxen, Naprosyn and Aspirin based products such as Excedrin, Goodys Powder, BC Powder. Stop ANY OVER THE COUNTER supplements until after surgery. You may however, continue to take Tylenol if needed for pain up until the day of surgery.  No Alcohol for 24 hours before or after surgery.  No Smoking including e-cigarettes for 24 hours prior to surgery.  No chewable tobacco products for at least 6 hours prior to surgery.  No nicotine patches on the day of surgery.  Do not use any "recreational" drugs for at least a week prior to your surgery.  Please be advised that the combination of cocaine and anesthesia may have negative outcomes, up to and including death. If you test positive for cocaine, your surgery will be cancelled.  On the morning of surgery brush your teeth with toothpaste and water,  you may rinse your mouth with mouthwash if you wish. Do not swallow any toothpaste or mouthwash.  Do not wear jewelry, make-up, hairpins, clips or nail polish.  Do not wear lotions, powders, or perfumes.   Do not shave body from the neck down 48 hours prior to surgery just in case you cut yourself which could leave a site for infection.   Do not bring valuables to the hospital. [facility] is not responsible for any missing/lost belongings or valuables.   Notify your doctor if there is any change in your medical condition (cold, fever, infection).  Wear comfortable clothing (specific to your surgery type) to the hospital.  After surgery, you can help prevent lung complications by doing breathing exercises.  Take deep breaths and cough every 1-2 hours. Your doctor may order a device called an Incentive Spirometer to help you take deep breaths.  If you are being discharged the day of surgery, you will not be allowed to drive home. You will need a responsible adult (18 years or older) to drive you home and stay with you that night.   If you are taking public transportation, you will need to have a responsible adult (18 years or older) with you. Please confirm with your physician that it is acceptable to use public transportation.   Please call the Pre-admissions Testing Dept. at [phone] if you have any questions about these instructions.  Surgery Visitation Policy:  Patients undergoing a surgery or procedure may have one family member or  support person with them as long as that person is not COVID-19 positive or experiencing its symptoms.  That person may remain in the waiting area during the procedure and may rotate out with other people.  Inpatient Visitation:    Visiting hours are 7 a.m. to 8 p.m. Up to two visitors ages 16+ are allowed at one time in a patient room. The visitors may rotate out with other people during the day. Visitors must check out when they leave, or  other visitors will not be allowed. One designated support person may remain overnight. The visitor must pass COVID-19 screenings, use hand sanitizer when entering and exiting the patient's room and wear a mask at all times, including in the patient's room. Patients must also wear a mask when staff or their visitor are in the room. Masking is required regardless of vaccination status.

## 2020-12-07 ENCOUNTER — Encounter: Payer: Self-pay | Admitting: Urgent Care

## 2020-12-07 ENCOUNTER — Other Ambulatory Visit: Payer: Self-pay

## 2020-12-07 ENCOUNTER — Other Ambulatory Visit
Admission: RE | Admit: 2020-12-07 | Discharge: 2020-12-07 | Disposition: A | Discharge status: Home / Self Care | Payer: Medicaid Other | Source: Ambulatory Visit | Attending: Obstetrics and Gynecology | Admitting: Obstetrics and Gynecology

## 2020-12-07 DIAGNOSIS — G8929 Other chronic pain: Secondary | ICD-10-CM | POA: Insufficient documentation

## 2020-12-07 DIAGNOSIS — R102 Pelvic and perineal pain: Secondary | ICD-10-CM | POA: Insufficient documentation

## 2020-12-07 DIAGNOSIS — Z01812 Encounter for preprocedural laboratory examination: Secondary | ICD-10-CM | POA: Insufficient documentation

## 2020-12-07 LAB — BASIC METABOLIC PANEL
Anion gap: 5 (ref 5–15)
BUN: 17 mg/dL (ref 6–20)
CO2: 28 mmol/L (ref 22–32)
Calcium: 9.2 mg/dL (ref 8.9–10.3)
Chloride: 104 mmol/L (ref 98–111)
Creatinine, Ser: 0.41 mg/dL — ABNORMAL LOW (ref 0.44–1.00)
GFR, Estimated: >60 mL/min (ref >60)
Glucose, Bld: 95 mg/dL (ref 70–99)
Potassium: 3.8 mmol/L (ref 3.5–5.1)
Sodium: 137 mmol/L (ref 135–145)

## 2020-12-07 LAB — CBC
HCT: 36.9 % (ref 36.0–46.0)
Hemoglobin: 12.2 g/dL (ref 12.0–15.0)
MCH: 29.3 pg (ref 26.0–34.0)
MCHC: 33.1 g/dL (ref 30.0–36.0)
MCV: 88.7 fL (ref 80.0–100.0)
Platelets: 276 10*3/uL (ref 150–400)
RBC: 4.16 MIL/uL (ref 3.87–5.11)
RDW: 11.9 % (ref 11.5–15.5)
WBC: 4.9 10*3/uL (ref 4.0–10.5)
nRBC: 0 % (ref 0.0–0.2)

## 2020-12-07 LAB — TYPE AND SCREEN
ABO/RH(D): O POS
Antibody Screen: NEGATIVE

## 2020-12-11 ENCOUNTER — Encounter: Payer: Self-pay | Admitting: Obstetrics and Gynecology

## 2020-12-11 ENCOUNTER — Other Ambulatory Visit: Payer: Self-pay

## 2020-12-11 ENCOUNTER — Encounter
Admission: RE | Disposition: A | Discharge status: Home / Self Care | Payer: Self-pay | Source: Ambulatory Visit | Attending: Obstetrics and Gynecology

## 2020-12-11 ENCOUNTER — Ambulatory Visit: Payer: Medicaid Other | Admitting: Certified Registered"

## 2020-12-11 ENCOUNTER — Ambulatory Visit
Admission: RE | Admit: 2020-12-11 | Discharge: 2020-12-11 | Disposition: A | Discharge status: Home / Self Care | Payer: Medicaid Other | Source: Ambulatory Visit | Attending: Obstetrics and Gynecology | Admitting: Obstetrics and Gynecology

## 2020-12-11 DIAGNOSIS — N945 Secondary dysmenorrhea: Secondary | ICD-10-CM | POA: Insufficient documentation

## 2020-12-11 DIAGNOSIS — R102 Pelvic and perineal pain: Secondary | ICD-10-CM

## 2020-12-11 DIAGNOSIS — G8929 Other chronic pain: Secondary | ICD-10-CM

## 2020-12-11 DIAGNOSIS — N96 Recurrent pregnancy loss: Secondary | ICD-10-CM | POA: Diagnosis not present

## 2020-12-11 HISTORY — PX: HYSTEROSCOPY WITH D & C: SHX1775

## 2020-12-11 HISTORY — PX: LAPAROSCOPY: SHX197

## 2020-12-11 HISTORY — PX: BIOPSY: SHX5522

## 2020-12-11 LAB — POCT PREGNANCY, URINE: Preg Test, Ur: NEGATIVE

## 2020-12-11 SURGERY — LAPAROSCOPY, DIAGNOSTIC
Anesthesia: General

## 2020-12-11 MED ORDER — FAMOTIDINE 20 MG PO TABS
ORAL_TABLET | ORAL | Status: AC
  Administered 2020-12-11: 20 mg via ORAL
  Filled 2020-12-11: qty 1

## 2020-12-11 MED ORDER — FENTANYL CITRATE (PF) 100 MCG/2ML IJ SOLN
INTRAMUSCULAR | Status: AC
  Administered 2020-12-11: 25 ug via INTRAVENOUS
  Filled 2020-12-11: qty 2

## 2020-12-11 MED ORDER — SUGAMMADEX SODIUM 200 MG/2ML IV SOLN
INTRAVENOUS | Status: DC | PRN
  Administered 2020-12-11: 200 mg via INTRAVENOUS

## 2020-12-11 MED ORDER — LACTATED RINGERS IV SOLN: INTRAVENOUS | Status: DC

## 2020-12-11 MED ORDER — ACETAMINOPHEN 500 MG PO TABS
ORAL_TABLET | ORAL | Status: AC
  Administered 2020-12-11: 1000 mg via ORAL
  Filled 2020-12-11: qty 2

## 2020-12-11 MED ORDER — ACETAMINOPHEN 500 MG PO TABS: 1000.0000 mg | ORAL_TABLET | Freq: Four times a day (QID) | ORAL | 0 refills | Status: AC

## 2020-12-11 MED ORDER — DOCUSATE SODIUM 100 MG PO CAPS: 100.0000 mg | ORAL_CAPSULE | Freq: Two times a day (BID) | ORAL | 0 refills | Status: DC

## 2020-12-11 MED ORDER — EPHEDRINE SULFATE 50 MG/ML IJ SOLN
INTRAMUSCULAR | Status: DC | PRN
  Administered 2020-12-11 (×2): 5 mg via INTRAVENOUS

## 2020-12-11 MED ORDER — ACETAMINOPHEN 10 MG/ML IV SOLN
INTRAVENOUS | Status: AC
  Filled 2020-12-11: qty 100

## 2020-12-11 MED ORDER — OXYCODONE HCL 5 MG PO TABS
ORAL_TABLET | ORAL | Status: AC
  Filled 2020-12-11: qty 1

## 2020-12-11 MED ORDER — CHLORHEXIDINE GLUCONATE 0.12 % MT SOLN: 15.0000 mL | Freq: Once | OROMUCOSAL | Status: AC

## 2020-12-11 MED ORDER — DEXMEDETOMIDINE (PRECEDEX) IN NS 20 MCG/5ML (4 MCG/ML) IV SYRINGE
PREFILLED_SYRINGE | INTRAVENOUS | Status: DC | PRN
  Administered 2020-12-11: 12 ug via INTRAVENOUS
  Administered 2020-12-11: 8 ug via INTRAVENOUS

## 2020-12-11 MED ORDER — HYDROMORPHONE HCL 1 MG/ML IJ SOLN
INTRAMUSCULAR | Status: DC | PRN
  Administered 2020-12-11 (×2): .5 mg via INTRAVENOUS

## 2020-12-11 MED ORDER — PHENYLEPHRINE HCL (PRESSORS) 10 MG/ML IV SOLN
INTRAVENOUS | Status: DC | PRN
  Administered 2020-12-11: 80 ug via INTRAVENOUS

## 2020-12-11 MED ORDER — CHLORHEXIDINE GLUCONATE 0.12 % MT SOLN
OROMUCOSAL | Status: AC
  Administered 2020-12-11: 15 mL via OROMUCOSAL
  Filled 2020-12-11: qty 15

## 2020-12-11 MED ORDER — ORAL CARE MOUTH RINSE: 15.0000 mL | Freq: Once | OROMUCOSAL | Status: AC

## 2020-12-11 MED ORDER — GLYCOPYRROLATE 0.2 MG/ML IJ SOLN
INTRAMUSCULAR | Status: DC | PRN
  Administered 2020-12-11: .2 mg via INTRAVENOUS

## 2020-12-11 MED ORDER — OXYCODONE HCL 5 MG PO TABS: 5.0000 mg | ORAL_TABLET | ORAL | 0 refills | Status: DC | PRN

## 2020-12-11 MED ORDER — PROPOFOL 10 MG/ML IV BOLUS
INTRAVENOUS | Status: AC
  Filled 2020-12-11: qty 20

## 2020-12-11 MED ORDER — GABAPENTIN 300 MG PO CAPS: 300.0000 mg | ORAL_CAPSULE | ORAL | Status: AC

## 2020-12-11 MED ORDER — PROPOFOL 10 MG/ML IV BOLUS
INTRAVENOUS | Status: DC | PRN
  Administered 2020-12-11: 160 mg via INTRAVENOUS

## 2020-12-11 MED ORDER — PHENYLEPHRINE 40 MCG/ML (10ML) SYRINGE FOR IV PUSH (FOR BLOOD PRESSURE SUPPORT)
PREFILLED_SYRINGE | INTRAVENOUS | Status: DC | PRN
  Administered 2020-12-11: 80 ug via INTRAVENOUS

## 2020-12-11 MED ORDER — MIDAZOLAM HCL 2 MG/2ML IJ SOLN
INTRAMUSCULAR | Status: DC | PRN
  Administered 2020-12-11: 2 mg via INTRAVENOUS

## 2020-12-11 MED ORDER — LIDOCAINE HCL (CARDIAC) PF 100 MG/5ML IV SOSY
PREFILLED_SYRINGE | INTRAVENOUS | Status: DC | PRN
  Administered 2020-12-11: 100 mg via INTRAVENOUS

## 2020-12-11 MED ORDER — DOXYCYCLINE HYCLATE 100 MG PO TABS
100.0000 mg | ORAL_TABLET | Freq: Once | ORAL | Status: AC
  Administered 2020-12-11: 100 mg via ORAL
  Filled 2020-12-11: qty 1

## 2020-12-11 MED ORDER — DEXAMETHASONE SODIUM PHOSPHATE 10 MG/ML IJ SOLN
INTRAMUSCULAR | Status: DC | PRN
  Administered 2020-12-11: 10 mg via INTRAVENOUS

## 2020-12-11 MED ORDER — IBUPROFEN 800 MG PO TABS: 800.0000 mg | ORAL_TABLET | Freq: Three times a day (TID) | ORAL | 1 refills | Status: AC

## 2020-12-11 MED ORDER — POVIDONE-IODINE 10 % EX SWAB
2.0000 "application " | Freq: Once | CUTANEOUS | Status: AC
  Administered 2020-12-11: 2 via TOPICAL

## 2020-12-11 MED ORDER — DOXYCYCLINE HYCLATE 100 MG PO TABS
200.0000 mg | ORAL_TABLET | Freq: Once | ORAL | Status: AC
  Administered 2020-12-11: 200 mg via ORAL
  Filled 2020-12-11: qty 2

## 2020-12-11 MED ORDER — OXYCODONE HCL 5 MG/5ML PO SOLN: 5.0000 mg | Freq: Once | ORAL | Status: AC | PRN

## 2020-12-11 MED ORDER — BUPIVACAINE HCL 0.5 % IJ SOLN
INTRAMUSCULAR | Status: DC | PRN
  Administered 2020-12-11: 10 mL

## 2020-12-11 MED ORDER — FENTANYL CITRATE (PF) 100 MCG/2ML IJ SOLN
25.0000 ug | INTRAMUSCULAR | Status: DC | PRN
  Administered 2020-12-11: 25 ug via INTRAVENOUS

## 2020-12-11 MED ORDER — HYDROMORPHONE HCL 1 MG/ML IJ SOLN
INTRAMUSCULAR | Status: AC
  Filled 2020-12-11: qty 1

## 2020-12-11 MED ORDER — ONDANSETRON HCL 4 MG/2ML IJ SOLN
INTRAMUSCULAR | Status: DC | PRN
  Administered 2020-12-11 (×2): 4 mg via INTRAVENOUS

## 2020-12-11 MED ORDER — OXYCODONE HCL 5 MG PO TABS
5.0000 mg | ORAL_TABLET | Freq: Once | ORAL | Status: AC | PRN
  Administered 2020-12-11: 5 mg via ORAL

## 2020-12-11 MED ORDER — BUPIVACAINE HCL (PF) 0.5 % IJ SOLN
INTRAMUSCULAR | Status: AC
  Filled 2020-12-11: qty 30

## 2020-12-11 MED ORDER — MIDAZOLAM HCL 2 MG/2ML IJ SOLN
INTRAMUSCULAR | Status: AC
  Filled 2020-12-11: qty 2

## 2020-12-11 MED ORDER — ROCURONIUM BROMIDE 100 MG/10ML IV SOLN
INTRAVENOUS | Status: DC | PRN
  Administered 2020-12-11: 20 mg via INTRAVENOUS
  Administered 2020-12-11: 50 mg via INTRAVENOUS

## 2020-12-11 MED ORDER — ACETAMINOPHEN 500 MG PO TABS: 1000.0000 mg | ORAL_TABLET | ORAL | Status: AC

## 2020-12-11 MED ORDER — FENTANYL CITRATE (PF) 100 MCG/2ML IJ SOLN
INTRAMUSCULAR | Status: DC | PRN
  Administered 2020-12-11: 50 ug via INTRAVENOUS

## 2020-12-11 MED ORDER — FENTANYL CITRATE (PF) 100 MCG/2ML IJ SOLN
INTRAMUSCULAR | Status: AC
  Filled 2020-12-11: qty 2

## 2020-12-11 MED ORDER — SODIUM CHLORIDE 0.9 % IR SOLN
Status: DC | PRN
  Administered 2020-12-11: 3000 mL

## 2020-12-11 MED ORDER — 0.9 % SODIUM CHLORIDE (POUR BTL) OPTIME
TOPICAL | Status: DC | PRN
  Administered 2020-12-11: 500 mL

## 2020-12-11 MED ORDER — FAMOTIDINE 20 MG PO TABS: 20.0000 mg | ORAL_TABLET | Freq: Once | ORAL | Status: AC

## 2020-12-11 MED ORDER — GABAPENTIN 300 MG PO CAPS
ORAL_CAPSULE | ORAL | Status: AC
  Administered 2020-12-11: 300 mg via ORAL
  Filled 2020-12-11: qty 1

## 2020-12-11 SURGICAL SUPPLY — 68 items
ADH SKN CLS APL DERMABOND .7 (GAUZE/BANDAGES/DRESSINGS) ×2
APL PRP STRL LF DISP 70% ISPRP (MISCELLANEOUS) ×2
APL SRG 38 LTWT LNG FL B (MISCELLANEOUS) ×2
APPLICATOR ARISTA FLEXITIP XL (MISCELLANEOUS) ×3 IMPLANT
BAG DRN RND TRDRP ANRFLXCHMBR (UROLOGICAL SUPPLIES) ×2
BAG INFUSER PRESSURE 100CC (MISCELLANEOUS) ×3 IMPLANT
BAG SPEC RTRVL LRG 6X4 10 (ENDOMECHANICALS)
BAG URINE DRAIN 2000ML AR STRL (UROLOGICAL SUPPLIES) ×3 IMPLANT
BLADE SURG SZ11 CARB STEEL (BLADE) ×3 IMPLANT
CATH FOLEY 2WAY  5CC 16FR (CATHETERS) ×1
CATH FOLEY 2WAY 5CC 16FR (CATHETERS) ×2
CATH URTH 16FR FL 2W BLN LF (CATHETERS) ×2 IMPLANT
CHLORAPREP W/TINT 26 (MISCELLANEOUS) ×3 IMPLANT
CORD MONOPOLAR M/FML 12FT (MISCELLANEOUS) ×3 IMPLANT
DERMABOND ADVANCED (GAUZE/BANDAGES/DRESSINGS) ×1
DERMABOND ADVANCED .7 DNX12 (GAUZE/BANDAGES/DRESSINGS) ×2 IMPLANT
DEVICE MYOSURE LITE (MISCELLANEOUS) IMPLANT
DRAPE STERI POUCH LG 24X46 STR (DRAPES) IMPLANT
DRSG TELFA 3X8 NADH (GAUZE/BANDAGES/DRESSINGS) IMPLANT
ELECT REM PT RETURN 9FT ADLT (ELECTROSURGICAL) ×3
ELECTRODE REM PT RTRN 9FT ADLT (ELECTROSURGICAL) ×2 IMPLANT
GAUZE 4X4 16PLY ~~LOC~~+RFID DBL (SPONGE) ×6 IMPLANT
GLOVE SURG ENC MOIS LTX SZ7 (GLOVE) ×6 IMPLANT
GLOVE SURG SYN 8.0 (GLOVE) ×3 IMPLANT
GLOVE SURG UNDER LTX SZ7.5 (GLOVE) ×3 IMPLANT
GOWN STRL REUS W/ TWL LRG LVL3 (GOWN DISPOSABLE) ×4 IMPLANT
GOWN STRL REUS W/ TWL XL LVL3 (GOWN DISPOSABLE) ×2 IMPLANT
GOWN STRL REUS W/TWL LRG LVL3 (GOWN DISPOSABLE) ×6
GOWN STRL REUS W/TWL XL LVL3 (GOWN DISPOSABLE) ×3
HEMOSTAT ARISTA ABSORB 3G PWDR (HEMOSTASIS) IMPLANT
IRRIGATION STRYKERFLOW (MISCELLANEOUS) IMPLANT
IRRIGATOR STRYKERFLOW (MISCELLANEOUS)
IV NS 1000ML (IV SOLUTION)
IV NS 1000ML BAXH (IV SOLUTION) IMPLANT
IV NS IRRIG 3000ML ARTHROMATIC (IV SOLUTION) ×3 IMPLANT
KIT PINK PAD W/HEAD ARE REST (MISCELLANEOUS) ×3
KIT PINK PAD W/HEAD ARM REST (MISCELLANEOUS) ×2 IMPLANT
KIT PROCEDURE FLUENT (KITS) ×3 IMPLANT
KIT TURNOVER CYSTO (KITS) ×3 IMPLANT
L-HOOK LAP DISP 36CM (ELECTROSURGICAL)
LABEL OR SOLS (LABEL) ×3 IMPLANT
LHOOK LAP DISP 36CM (ELECTROSURGICAL) IMPLANT
LIGASURE VESSEL 5MM BLUNT TIP (ELECTROSURGICAL) IMPLANT
MANIFOLD NEPTUNE II (INSTRUMENTS) ×3 IMPLANT
NS IRRIG 500ML POUR BTL (IV SOLUTION) ×3 IMPLANT
PACK DNC HYST (MISCELLANEOUS) ×3 IMPLANT
PACK GYN LAPAROSCOPIC (MISCELLANEOUS) ×3 IMPLANT
PAD OB MATERNITY 4.3X12.25 (PERSONAL CARE ITEMS) ×3 IMPLANT
PAD PREP 24X41 OB/GYN DISP (PERSONAL CARE ITEMS) ×3 IMPLANT
PENCIL ELECTRO HAND CTR (MISCELLANEOUS) IMPLANT
POUCH SPECIMEN RETRIEVAL 10MM (ENDOMECHANICALS) IMPLANT
SCISSORS METZENBAUM CVD 33 (INSTRUMENTS) IMPLANT
SCRUB EXIDINE 4% CHG 4OZ (MISCELLANEOUS) ×3 IMPLANT
SEAL ROD LENS SCOPE MYOSURE (ABLATOR) ×3 IMPLANT
SET CYSTO W/LG BORE CLAMP LF (SET/KITS/TRAYS/PACK) IMPLANT
SET TUBE SMOKE EVAC HIGH FLOW (TUBING) ×3 IMPLANT
SLEEVE ENDOPATH XCEL 5M (ENDOMECHANICALS) ×3 IMPLANT
STRIP CLOSURE SKIN 1/4X4 (GAUZE/BANDAGES/DRESSINGS) IMPLANT
SUT MNCRL AB 4-0 PS2 18 (SUTURE) ×3 IMPLANT
SUT VIC AB 2-0 UR6 27 (SUTURE) ×3 IMPLANT
SUT VIC AB 4-0 SH 27 (SUTURE) ×3
SUT VIC AB 4-0 SH 27XANBCTRL (SUTURE) ×2 IMPLANT
SYR 50ML LL SCALE MARK (SYRINGE) IMPLANT
SYR 5ML LL (SYRINGE) IMPLANT
TROCAR XCEL NON-BLD 5MMX100MML (ENDOMECHANICALS) ×3 IMPLANT
TUBING ART PRESS 48 MALE/FEM (TUBING) IMPLANT
TUBING CONNECTING 10 (TUBING) ×3 IMPLANT
WATER STERILE IRR 500ML POUR (IV SOLUTION) ×3 IMPLANT

## 2020-12-11 NOTE — Transfer of Care (Addendum)
Immediate Anesthesia Transfer of Care Note  Patient: Sharon Houston  Procedure(s) Performed: LAPAROSCOPY DIAGNOSTIC, POSSIBLE EXCISION OF UTERINE SEPTUM, POSSIBLE EXCISION OF ENDOMETRIOSIS, DILATATION AND CURETTAGE /HYSTEROSCOPY BIOPSY of peritoneum (Bilateral)  Patient Location: PACU  Anesthesia Type:General  Level of Consciousness: awake, drowsy and patient cooperative  Airway & Oxygen Therapy: Patient Spontanous Breathing and Patient connected to face mask oxygen  Post-op Assessment: Report given to RN and Post -op Vital signs reviewed and stable  Post vital signs: Reviewed and stable  Last Vitals:  Vitals Value Taken Time  BP 112/65 12/11/20 0927  Temp 36.6 C 12/11/20 0927  Pulse 66 12/11/20 0929  Resp 18 12/11/20 0929  SpO2 100 % 12/11/20 0929  Vitals shown include unvalidated device data.  Last Pain:  Vitals:   12/11/20 0927  TempSrc:   PainSc: Asleep         Complications: No notable events documented.

## 2020-12-11 NOTE — Anesthesia Preprocedure Evaluation (Signed)
Anesthesia Evaluation  Patient identified by MRN, date of birth, ID band Patient awake    Reviewed: Allergy & Precautions, NPO status , Patient's Chart, lab work & pertinent test results  History of Anesthesia Complications Negative for: history of anesthetic complications  Airway Mallampati: III  TM Distance: >3 FB Neck ROM: full    Dental  (+) Chipped   Pulmonary neg pulmonary ROS, neg shortness of breath,    Pulmonary exam normal        Cardiovascular (-) angina(-) Past MI negative cardio ROS Normal cardiovascular exam     Neuro/Psych negative neurological ROS  negative psych ROS   GI/Hepatic negative GI ROS, Neg liver ROS, neg GERD  ,  Endo/Other  negative endocrine ROS  Renal/GU      Musculoskeletal   Abdominal   Peds  Hematology negative hematology ROS (+)   Anesthesia Other Findings History reviewed. No pertinent past medical history.  Past Surgical History: 04/01/2020: DILATION AND EVACUATION; N/A     Comment:  Procedure: DILATATION AND EVACUATION;  Surgeon: Christeen Douglas, MD;  Location: [facility] ORS;  Service: Gynecology;                Laterality: N/A;  BMI    Body Mass Index: 27.25 kg/m      Reproductive/Obstetrics negative OB ROS                             Anesthesia Physical Anesthesia Plan  ASA: 1  Anesthesia Plan: General ETT   Post-op Pain Management:    Induction: Intravenous  PONV Risk Score and Plan: Ondansetron, Dexamethasone, Midazolam and Treatment may vary due to age or medical condition  Airway Management Planned: Oral ETT  Additional Equipment:   Intra-op Plan:   Post-operative Plan: Extubation in OR  Informed Consent: I have reviewed the patients History and Physical, chart, labs and discussed the procedure including the risks, benefits and alternatives for the proposed anesthesia with the patient or authorized representative  who has indicated his/her understanding and acceptance.     Dental Advisory Given  Plan Discussed with: Anesthesiologist, CRNA and Surgeon  Anesthesia Plan Comments: (Patient consented for risks of anesthesia including but not limited to:  - adverse reactions to medications - damage to eyes, teeth, lips or other oral mucosa - nerve damage due to positioning  - sore throat or hoarseness - Damage to heart, brain, nerves, lungs, other parts of body or loss of life  Patient voiced understanding.)        Anesthesia Quick Evaluation

## 2020-12-11 NOTE — Op Note (Addendum)
E[facility] PROCEDURE DATE: 12/11/2020  PREOPERATIVE DIAGNOSIS: Secondary dysmenorrha, recurrent pregnancy loss POSTOPERATIVE DIAGNOSIS: same PROCEDURE:  -Operative hysteroscopy -Evaluation of endometrial septum and small excision of septum -Operative laparoscopy -Peritoneal biopsies  SURGEON:  Dr. Christeen Douglas, MD ASSISTANT: Dr. Jennell Corner, MD  ANESTHESIOLOGIST: Piscitello, Cleda Mccreedy, MD Anesthesiologist: Randa Ngo Cleda Mccreedy, MD CRNA: Mohammed Kindle, CRNA  INDICATIONS: 25 y.o. [phone] with history of secondary dysmenorrhea and recurrent pregnancy loss desiring surgical evaluation.   Please see preoperative notes for further details.  Risks of surgery were discussed with the patient including but not limited to: bleeding which may require transfusion or reoperation; infection which may require antibiotics; injury to bowel, bladder, ureters or other surrounding organs; need for additional procedures including laparotomy; thromboembolic phenomenon, incisional problems and other postoperative/anesthesia complications. Written informed consent was obtained.    FINDINGS:  Small retroverted uterus, normal ovaries and fallopian tubes bilaterally. Prominent adnexa vasculature.  Peritoneal biopsies were taken and sent to pathology. No other abdominal/pelvic abnormality.  Normal upper abdomen. Normal appendix. Apparent thin endometrial septum.  Right fallopian tube ostium not visualized, as the right cornua was deep, leading to my suggestion of uterine septum.  ANESTHESIA:    General INTRAVENOUS FLUIDS: see anesthesia ESTIMATED BLOOD LOSS: minimal URINE OUTPUT: 150 ml SPECIMENS: Peritoneal biopsies COMPLICATIONS: None immediate  PROCEDURE IN DETAIL:  The patient had sequential compression devices applied to her lower extremities while in the preoperative area.  She was then taken to the operating room where general anesthesia was administered and was found to be adequate.   She was placed in the dorsal lithotomy position, and was prepped and draped in a sterile manner.    Her bladder was catheterized for an estimated amount of clear, yellow urine. A bivalve speculum was then placed in the patient's vagina and a single tooth tenaculum was applied to the anterior lip of the cervix.  Her cervix was serially dilated to 15 [place] using Hanks dilators. The hysteroscope was introduced to reveal the above findings.  The uterine cavity was carefully examined.  The left tubal ostium was visible, and the right tubal ostium was not.  The decision was made to place a laparoscope and to evaluate the uterus from above.  By a second surgeon, the Optiview 5-mm trocar and sleeve were then advanced without difficulty with the laparoscope under direct visualization into the abdomen.  The abdomen was then insufflated with carbon dioxide gas and adequate pneumoperitoneum was obtained.   A detailed survey of the patient's pelvis and abdomen revealed the findings as mentioned above.    The pelvis and uterus was visualized, and no bicornuate evidence was noted.  The fundus was firm and smooth, and the hysteroscope light was consistent across the entire fundus.  Using a MyoSure device, the right tubal ostium was attempted to be visualized by removing a small portion of the fundal endometrial tissue, to allow this ostium to merge on a flat plane with the left tubal ostium.  This was done under direct visualization, and the MyoSure device only worked once.  Enough of the small septum had been removed that even though there was an error with the MyoSure device, I did not replace it to continue surgery.  Attention was turned entirely to the abdomen.  A full four-quadrant review of the pelvis was undertaken, with no findings of new or old endometriosis.  The left adnexal vessels appear to be particularly prominent, but still in the range of normal.  Biopsy forceps were used to  take peritoneal biopsies in  the bilateral ovarian fossa, after assuring the ureters bilaterally were out of the operative field.  A single cautery with the Kleppinger was used to assure hemostasis on the right.  The operative site was surveyed, and it was found to be hemostatic.  No intraoperative injury to surrounding organs was noted.  Pictures were taken of the quadrants and pelvis. The abdomen was desufflated and all instruments were then removed from the patient's abdomen. The uterine manipulator was removed without complications.  All incisions were closed with 4-0 Vicryl and Dermabond.   The patient tolerated the procedures well.  All instruments, needles, and sponge counts were correct x 2. The patient was taken to the recovery room in stable condition.

## 2020-12-11 NOTE — Discharge Instructions (Addendum)
Laparoscopic Ovarian Surgery Discharge Instructions  For the next three days, take ibuprofen and acetaminophen on a schedule, every 8 hours. You can take them together or you can intersperse them, and take one every four hours. I also gave you gabapentin for nighttime, to help you sleep and also to control pain. Take gabapentin medicines at night for at least the next 3 nights. You also have a narcotic, oxycodone, to take as needed if the above medicines don't help.  Postop constipation is a major cause of pain. Stay well hydrated, walk as you tolerate, and take over the counter senna as well as stool softeners if you need them.   RISKS AND COMPLICATIONS  Infection. Bleeding. Injury to surrounding organs. Anesthetic side effects.   PROCEDURE  You may be given a medicine to help you relax (sedative) before the procedure. You will be given a medicine to make you sleep (general anesthetic) during the procedure. A tube will be put down your throat to help your breath while under general anesthesia. Several small cuts (incisions) are made in the lower abdominal area and one incision is made near the belly button. Your abdominal area will be inflated with a safe gas (carbon dioxide). This helps give the surgeon room to operate, visualize, and helps the surgeon avoid other organs. A thin, lighted tube (laparoscope) with a camera attached is inserted into your abdomen through the incision near the belly button. Other small instruments may also be inserted through other abdominal incisions. The ovary is located and are removed. After the ovary is removed, the gas is released from the abdomen. The incisions will be closed with stitches (sutures), and Dermabond. A bandage may be placed over the incisions.  AFTER THE PROCEDURE  You will also have some mild abdominal discomfort for 3-7 days. You will be given pain medicine to ease any discomfort. As long as there are no problems, you may be allowed to  go home. Someone will need to drive you home and be with you for at least 24 hours once home. You may have some mild discomfort in the throat. This is from the tube placed in your throat while you were sleeping. You may experience discomfort in the shoulder area from some trapped air between the liver and diaphragm. This sensation is normal and will slowly go away on its own.  HOME CARE INSTRUCTIONS  Take all medicines as directed. Only take over-the-counter or prescription medicines for pain, discomfort, or fever as directed by your caregiver. Resume daily activities as directed. Showers are preferred over baths for 2 weeks. You may resume sexual activities in 1 week or as you feel you would like to. Do not drive while taking narcotics.  SEEK MEDICAL CARE IF: . There is increasing abdominal pain. You feel lightheaded or faint. You have the chills. You have an oral temperature above 102 F (38.9 C). There is pus-like (purulent) drainage from any of the wounds. You are unable to pass gas or have a bowel movement. You feel sick to your stomach (nauseous) or throw up (vomit) and can't control it with your medicines.  MAKE SURE YOU:  Understand these instructions. Will watch your condition. Will get help right away if you are not doing well or get worse.  ExitCare Patient Information 2013 [employer].    Here is a helpful article from the website [employer], regarding constipation  Here are reasons why constipation occurs after surgery: 1) During the operation and in the recovery room, most people   are given opioid pain medication, primarily through an IV, to treat moderate or severe pain. Intravenous opioids include morphine, Dilaudid and fentanyl. After surgery, patients are often prescribed opioid pain medication to take by mouth at home, including codeine, Vicodin, Norco, and Percocet. All of these medications cause constipation by slowing down the movement of your  intestine. 2) Changes in your diet before surgery can be another culprit. It is common to get specific instructions to change how you normally eat or drink before your surgery, like only having liquids the day before or not having anything to eat or drink after midnight the night before surgery. For this reason, temporary dehydration may occur. This, along with not eating or only having liquids, means that you are getting less fiber than usual. Both these factors contribute to constipation. 3) Changes in your diet after surgery can also contribute to the problem. Although many people don't have dietary restrictions after operations, being under anesthesia can make you lose your appetite for several hours and maybe even days. Some people can even have nausea or vomiting. Not eating or drinking normally means that you are not getting enough fiber and you can get dehydrated, both leading to constipation. 4) Lying in a bed more than usual--which happens before, during and after surgery--combined with the medications and diet changes, all work together to slow down your colon and make your poop turn to rock.  No one likes to be constipated.  Let's face it, it's not a pleasant feeling when you don't poop for days, then strain on the toilet to finally pass something large enough to cause damage. An ounce of prevention is worth a pound of cure, so: Assume you will be constipated. Plan and prepare accordingly. Post-surgery is one of those unique situations where the temporary use of laxatives can make a world of difference. Always consult with your doctor, and recognize that if you wait several days after surgery to take a laxative, the constipation might be too severe for these over-the-counter options. It is always important to discuss all medications you plan on taking with your doctor. Ask your doctor if you can start the laxative immediately after surgery. *  Here are go-to post-surgery laxatives: Senna:  Senna is an herb that acts as a "stimulant laxative," meaning it increases the activity of the intestine to cause you to have a bowel movement. It comes in many forms, but senna pills are easy to take and are sold over the counter at almost all pharmacies. Since opioid pain medications slow down the activity of the intestine, it makes sense to take a medication to help reverse that side effect. Long-term use of a stimulant laxative is not a good idea since it can make your colon "lazy" and not function properly; however, temporary use immediately after surgery is acceptable. In general, if you are able to eat a normal diet, taking senna soon after surgery works the best. Senna usually works within hours to produce a bowel movement, but this is less predictable when you are taking different medications after surgery. Try not to wait several days to start taking senna, as often it is too late by then. Just like with all medications or supplements, check with your doctor before starting new treatment.   Magnesium: Magnesium is an important mineral that our body needs. We get magnesium from some foods that we eat, especially foods that are high in fiber such as broccoli, almonds and whole grains. There are also magnesium-based medications used   to treat constipation including milk of magnesia (magnesium hydroxide), magnesium citrate and magnesium oxide. They work by drawing water into the intestine, putting it into the class of "osmotic" laxatives. Magnesium products in low doses appear to be safe, but if taken in very large doses, can lead to problems such as irregular heartbeat, low blood pressure and even death. It can also affect other medications you might be taking, therefore it is important to discuss using magnesium with your physician and pharmacist before initiating therapy. Most over-the-counter magnesium laxatives work very well to help with the constipation related to surgery, but sometimes they work too  well and lead to diarrhea. Make sure you are somewhere with easy access to a bathroom, just in case.   Bisacodyl: Bisacodyl (generic name) is sold under brand names such as Dulcolax. Much like senna, it is a "stimulant laxative," meaning it makes your intestines move more quickly to push out the stool. This is another good choice to start taking as soon as your doctor says you can take a laxative after surgery. It comes in pill form and as a suppository, which is a good choice for people who cannot or are not allowed to swallow pills. Studies have shown that it works as a laxative, but like most of these medications, you should use this on a short-term basis only.   Enema: Enemas strike fear in many people, but FEAR NOT! It's nowhere near as big a deal as you may think. An enema is just a way to get some liquid into your rectum by placing a specially designed device through your anus. If you have never done one, it might seem like a painful, unpleasant, uncomfortable, complicated and lengthy procedure. But in reality, it's simple, takes just a few seconds and is highly effective. The small ready-made bottles you buy at the pharmacy are much easier than the hose/large rubber container type. Those recommended positions illustrated in some instructions are generally not necessary to place the enema. It's very similar to the insertion of a tampon, requiring a slight squat. Some extra lubrication on the enema's tip (or on your anus) will make it a breeze. In certain cases, there is no substitute for a good enema. For example, if someone has not pooped for a few days, the beginning of the poop waiting to come out can become rock hard. Passing that hard stool can lead to much pain and problems like anal fissures. Inserting a little liquid to break up the rock-hard stool will help make its passage much easier. Enemas come with different liquids. Most come with saline, but there are also mineral oil options. You can  also use warm water in the reusable enema containers. They all work. But since saline can sometimes be irritating, so try a mineral oil or water enema instead.  Here are commonly recommended constipation medications that do not work well for post-surgery constipation: Docusate: Docusate (generic name) most commonly referred to as Colace (brand name) is not really a laxative, but is classified as a stool softener. Although this medication is commonly prescribed, it is not recommended for several reasons: 1) there is no good medical evidence that it works 2) even if it has an effect, which is very questionable, it is minimal and cannot combat the intestinal slowing caused by the opioid medications. Skip docusate to save money and space in your pillbox for something more effective.  PEG: Miralax (brand name) is basically a chemical called polyethylene glycol (PEG) and it has   gained tremendous popularity as a laxative. This product is an "osmotic laxative" meaning it works by pulling water into the stool, making it softer. This is very similar to the action of natural fiber in foods and supplements. Therefore, the effect seen by this medication is not immediate, causing a bowel movement in a day or more. Is this medication strong enough to battle the constipation related to having an operation? Maybe for some people not prone to constipation. But for most people, other laxatives are better to prevent constipation after surgery. AMBULATORY SURGERY  DISCHARGE INSTRUCTIONS   The drugs that you were given will stay in your system until tomorrow so for the next 24 hours you should not:  Drive an automobile Make any legal decisions Drink any alcoholic beverage   You may resume regular meals tomorrow.  Today it is better to start with liquids and gradually work up to solid foods.  You may eat anything you prefer, but it is better to start with liquids, then soup and crackers, and gradually work up to solid  foods.   Please notify your doctor immediately if you have any unusual bleeding, trouble breathing, redness and pain at the surgery site, drainage, fever, or pain not relieved by medication.    Additional Instructions:  Please contact your physician with any problems or Same Day Surgery at [phone], Monday through Friday 6 am to 4 pm, or [place] [facility] Main number at [phone]. 

## 2020-12-11 NOTE — Interval H&P Note (Signed)
History and Physical Interval Note:  12/11/2020 7:25 AM  Sharon Houston  has presented today for surgery, with the diagnosis of recurrent prenancy loss, chronic pelvic pain.  The various methods of treatment have been discussed with the patient and family. After consideration of risks, benefits and other options for treatment, the patient has consented to  Procedure(s): LAPAROSCOPY DIAGNOSTIC, POSSIBLE EXCISION OF UTERINE SEPTUM, POSSIBLE EXCISION OF ENDOMETRIOSIS, (N/A) DILATATION AND CURETTAGE /HYSTEROSCOPY (N/A) LYSIS OF ADHESION (N/A) as a surgical intervention.  The patient's history has been reviewed, patient examined, no change in status, stable for surgery.  I have reviewed the patient's chart and labs.  Questions were answered to the patient's satisfaction.     Christeen Douglas

## 2020-12-11 NOTE — Progress Notes (Signed)
   12/11/20 0715  Clinical Encounter Type  Visited With Patient  Visit Type Initial;Spiritual support;Social support  Referral From Chaplain  Consult/Referral To Chaplain   Chaplain visited PT at bedside. No family present in Pt's room at the moment. Chaplain made space for expression of emotions, and ministered with peaceful presence, and reflective listening. Chaplain told PT that chaplain services is available, if ever needed in the future.   Posey Boyer, M. Div.

## 2020-12-11 NOTE — Anesthesia Procedure Notes (Signed)
Procedure Name: Intubation Date/Time: 12/11/2020 7:53 AM Performed by: Mohammed Kindle, CRNA Pre-anesthesia Checklist: Patient identified, Emergency Drugs available, Suction available and Patient being monitored Patient Re-evaluated:Patient Re-evaluated prior to induction Oxygen Delivery Method: Circle system utilized Preoxygenation: Pre-oxygenation with 100% oxygen Induction Type: IV induction Ventilation: Mask ventilation without difficulty Laryngoscope Size: McGraph and 3 Grade View: Grade I Tube type: Oral Tube size: 6.5 mm Number of attempts: 1 Airway Equipment and Method: Stylet and Oral airway Placement Confirmation: ETT inserted through vocal cords under direct vision, positive ETCO2, breath sounds checked- equal and bilateral and CO2 detector Secured at: 21 cm Tube secured with: Tape Dental Injury: Teeth and Oropharynx as per pre-operative assessment

## 2020-12-11 NOTE — Anesthesia Postprocedure Evaluation (Signed)
Anesthesia Post Note  Patient: Sharon Houston  Procedure(s) Performed: LAPAROSCOPY DIAGNOSTIC, POSSIBLE EXCISION OF UTERINE SEPTUM, POSSIBLE EXCISION OF ENDOMETRIOSIS, DILATATION AND CURETTAGE /HYSTEROSCOPY BIOPSY of peritoneum (Bilateral)  Patient location during evaluation: PACU Anesthesia Type: General Level of consciousness: awake and alert Pain management: pain level controlled Vital Signs Assessment: post-procedure vital signs reviewed and stable Respiratory status: spontaneous breathing, nonlabored ventilation, respiratory function stable and patient connected to nasal cannula oxygen Cardiovascular status: blood pressure returned to baseline and stable Postop Assessment: no apparent nausea or vomiting Anesthetic complications: no   No notable events documented.   Last Vitals:  Vitals:   12/11/20 1012 12/11/20 1021  BP: 108/69 125/70  Pulse: 65 88  Resp: 10 14  Temp:  (!) 36.4 C  SpO2: 100% 98%    Last Pain:  Vitals:   12/11/20 1021  TempSrc: Temporal  PainSc: 3                  Cleda Mccreedy Angelica Frandsen

## 2020-12-14 LAB — SURGICAL PATHOLOGY

## 2020-12-17 ENCOUNTER — Encounter: Payer: Self-pay | Admitting: Obstetrics and Gynecology

## 2021-01-25 ENCOUNTER — Encounter: Payer: Self-pay | Admitting: Emergency Medicine

## 2021-01-25 ENCOUNTER — Emergency Department
Admission: EM | Admit: 2021-01-25 | Discharge: 2021-01-25 | Disposition: A | Discharge status: Home / Self Care | Payer: Medicaid Other | Attending: Emergency Medicine | Admitting: Emergency Medicine

## 2021-01-25 ENCOUNTER — Other Ambulatory Visit: Payer: Self-pay

## 2021-01-25 ENCOUNTER — Emergency Department: Payer: Medicaid Other

## 2021-01-25 DIAGNOSIS — U071 COVID-19: Secondary | ICD-10-CM

## 2021-01-25 DIAGNOSIS — R059 Cough, unspecified: Secondary | ICD-10-CM | POA: Diagnosis present

## 2021-01-25 NOTE — Discharge Instructions (Signed)
You may alternate Tylenol 1000 mg every 6 hours as needed for pain, fever and Ibuprofen 800 mg every 8 hours as needed for pain, fever.  Please take Ibuprofen with food.  Do not take more than 4000 mg of Tylenol (acetaminophen) in a 24 hour period.   Please return to the emergency department if your oxygen saturation at home is ever 89% or less and is persistently low, you have worsening shortness of breath, wheezing, blue lips or blue fingertips, chest pain, vomiting that does not stop, confusion.  Your chest x-ray today was normal and showed no sign of COVID-pneumonia, bacterial pneumonia.  Your oxygen levels were normal here at rest and with ambulation.

## 2021-01-25 NOTE — ED Provider Notes (Signed)
[facility] Provider Note    Event Date/Time   First MD Initiated Contact with Patient 01/25/21 0234     (approximate)   History   Shortness of Breath   HPI  Sharon Houston is a 26 y.o. female with no significant past medical history who presents to the emergency department with complaints of cough, shortness of breath, feeling lightheaded.  She states that she tested positive for COVID-19 on January 12.  Symptoms started on January 10.  She denies any vomiting or diarrhea.  No chest pain.   History provided by patient.      History reviewed. No pertinent past medical history.  Past Surgical History:  Procedure Laterality Date   BIOPSY Bilateral 12/11/2020   Procedure: BIOPSY of peritoneum;  Surgeon: Christeen Douglas, MD;  Location: [facility];  Service: Gynecology;  Laterality: Bilateral;   DILATION AND EVACUATION N/A 04/01/2020   Procedure: DILATATION AND EVACUATION;  Surgeon: Christeen Douglas, MD;  Location: [facility];  Service: Gynecology;  Laterality: N/A;   HYSTEROSCOPY WITH D & C N/A 12/11/2020   Procedure: DILATATION AND CURETTAGE /HYSTEROSCOPY;  Surgeon: Christeen Douglas, MD;  Location: [facility];  Service: Gynecology;  Laterality: N/A;   LAPAROSCOPY N/A 12/11/2020   Procedure: LAPAROSCOPY DIAGNOSTIC,  EXCISION OF UTERINE SEPTUM;  Surgeon: Christeen Douglas, MD;  Location: [facility];  Service: Gynecology;  Laterality: N/A;    MEDICATIONS:  Prior to Admission medications   Medication Sig Start Date End Date Taking? Authorizing Provider  docusate sodium (COLACE) 100 MG capsule Take 1 capsule (100 mg total) by mouth 2 (two) times daily. To keep stools soft 12/11/20   Christeen Douglas, MD  oxyCODONE (OXY IR/ROXICODONE) 5 MG immediate release tablet Take 1 tablet (5 mg total) by mouth every 4 (four) hours as needed for severe pain. 12/11/20   Christeen Douglas, MD    Physical Exam   Triage Vital Signs: ED Triage Vitals  Enc Vitals Group     BP  01/25/21 0118 113/66     Pulse Rate 01/25/21 0118 78     Resp 01/25/21 0118 16     Temp 01/25/21 0118 98 F (36.7 C)     Temp src --      SpO2 01/25/21 0118 100 %     Weight 01/25/21 0117 175 lb (79.4 kg)     Height 01/25/21 0117 5\' 7"  (1.702 m)     Head Circumference --      Peak Flow --      Pain Score 01/25/21 0117 0     Pain Loc --      Pain Edu? --      Excl. in GC? --     Most recent vital signs: Vitals:   01/25/21 0235 01/25/21 0303  BP:  111/70  Pulse:    Resp:  18  Temp:    SpO2: 97%     CONSTITUTIONAL: Alert and oriented and responds appropriately to questions. Well-appearing; well-nourished HEAD: Normocephalic, atraumatic EYES: Conjunctivae clear, pupils appear equal, sclera nonicteric ENT: normal nose; moist mucous membranes NECK: Supple, normal ROM CARD: RRR; S1 and S2 appreciated; no murmurs, no clicks, no rubs, no gallops RESP: Normal chest excursion without splinting or tachypnea; breath sounds clear and equal bilaterally; no wheezes, no rhonchi, no rales, no hypoxia or respiratory distress, speaking full sentences ABD/GI: Normal bowel sounds; non-distended; soft, non-tender, no rebound, no guarding, no peritoneal signs BACK: The back appears normal EXT: Normal ROM in all joints; no deformity noted,  no edema; no cyanosis SKIN: Normal color for age and race; warm; no rash on exposed skin NEURO: Moves all extremities equally, normal speech PSYCH: The patient's mood and manner are appropriate.   ED Results / Procedures / Treatments   LABS: (all labs ordered are listed, but only abnormal results are displayed) Labs Reviewed - No data to display   EKG:  EKG Interpretation  Date/Time:    Ventricular Rate:    PR Interval:    QRS Duration:   QT Interval:    QTC Calculation:   R Axis:     Text Interpretation:           RADIOLOGY: My personal review and interpretation of imaging: Chest x-ray clear.  I have personally reviewed all radiology  reports.   DG Chest 2 View  Result Date: 01/25/2021 CLINICAL DATA:  COVID positive with shortness of breath. EXAM: CHEST - 2 VIEW COMPARISON:  None. FINDINGS: The heart size and mediastinal contours are within normal limits. Both lungs are clear. The visualized skeletal structures are unremarkable. IMPRESSION: No active cardiopulmonary disease. Electronically Signed   By: Aram Candela M.D.   On: 01/25/2021 01:40     PROCEDURES:  Critical Care performed: No      Procedures    IMPRESSION / MDM / ASSESSMENT AND PLAN / ED COURSE  I reviewed the triage vital signs and the nursing notes.    Patient here with shortness of breath after recently being diagnosed with COVID-19.  The patient is on the cardiac monitor to evaluate for evidence of arrhythmia and/or significant heart rate changes.   DIFFERENTIAL DIAGNOSIS (includes but not limited to):   COVID-pneumonia, bacterial pneumonia, symptoms from COVID-19.  Patient has no history of asthma, COPD, CHF.  Doubt PE, ACS.   PLAN: We will obtain chest x-ray and obtain an ambulatory sat.   MEDICATIONS GIVEN IN ED: Medications - No data to display   ED COURSE: Patient's chest x-ray reviewed by myself and radiology shows no infiltrate, edema, pneumothorax.  She has been able to ambulate here and sats stay above 95%.  She is outside of treatment window for antiviral medications.  Discussed supportive care instructions including over-the-counter Tylenol, Motrin.  She does not require any prescriptions today.  I do not feel she needs admission to the hospital given she has no hypoxia, increased work of breathing or respiratory distress.  I feel she is safe for discharge home.  Provided with a work note.  She is comfortable with this plan.  At this time, I do not feel there is any life-threatening condition present. I reviewed all nursing notes, vitals, pertinent previous records.  All labs, EKGs, imaging ordered have been independently  reviewed and interpreted by myself.  I have reviewed nursing notes and appropriate previous records.  I feel the patient is safe to be discharged home without further emergent workup and can continue workup as an outpatient as needed. Discussed all findings and treatment plan with patient as well as usual and customary return precautions.  They verbalize understanding and are comfortable with this plan.  Outpatient follow-up has been provided as needed. All questions have been answered.    CONSULTS: No hospitalization needed at this time due to patient being well-appearing, nontoxic with no hypoxia.   OUTSIDE RECORDS REVIEWED: Reviewed recent operative note from Dr. 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  No abdominal pain today.  Abdominal exam is benign.  No vomiting or diarrhea.  FINAL CLINICAL IMPRESSION(S) / ED DIAGNOSES   Final diagnoses:  COVID-19     Rx / DC Orders   ED Discharge Orders     None        Note:  This document was prepared using Dragon voice recognition software and may include unintentional dictation errors.   Hayla Hinger, Layla MawKristen N, DO 01/25/21 [phone]

## 2021-01-25 NOTE — ED Triage Notes (Signed)
Pt tested positive for covid on 1/12. Pt states that now she feels SOB. Pt in NAD in triage.

## 2021-08-25 ENCOUNTER — Ambulatory Visit: Payer: BC Managed Care – PPO | Admitting: Nurse Practitioner

## 2021-08-25 ENCOUNTER — Encounter: Payer: Self-pay | Admitting: Nurse Practitioner

## 2021-08-25 VITALS — BP 116/80 | HR 75 | Temp 97.8°F | Wt 173.6 lb

## 2021-08-25 DIAGNOSIS — Z3A01 Less than 8 weeks gestation of pregnancy: Secondary | ICD-10-CM

## 2021-08-25 DIAGNOSIS — L700 Acne vulgaris: Secondary | ICD-10-CM | POA: Diagnosis not present

## 2021-08-25 DIAGNOSIS — N912 Amenorrhea, unspecified: Secondary | ICD-10-CM | POA: Diagnosis not present

## 2021-08-25 LAB — POCT URINE PREGNANCY: Preg Test, Ur: POSITIVE — AB

## 2021-08-25 NOTE — Progress Notes (Signed)
New Patient Visit  BP 116/80   Pulse 75   Temp 97.8 F (36.6 C) (Temporal)   Wt 173 lb 9.6 oz (78.7 kg)   LMP 07/20/2021 (Exact Date)   SpO2 98%   BMI 27.19 kg/m    Subjective:    Patient ID: Sharon Houston, female    DOB: 1995-09-03, 26 y.o.   MRN: [id number]  CC: Chief Complaint  Patient presents with   Establish Care    Np. Est care. Requesting referral for dermatology, recently found out she is pregnant.     HPI: Sharon Houston is a 26 y.o. female presents for new patient visit to establish care.  Introduced to Publishing rights manager role and practice setting.  All questions answered.  Discussed provider/patient relationship and expectations.  She has having issues with acne since having to wear a mask at work. This started about a year ago. At home, she is using witch hazel and tea tree oil, along with pimple patches.   Depression and Anxiety Screen done:      08/25/2021   10:04 AM  Depression screen PHQ 2/9  Decreased Interest 0  Down, Depressed, Hopeless 0  PHQ - 2 Score 0  Altered sleeping 0  Tired, decreased energy 0  Change in appetite 0  Feeling bad or failure about yourself  0  Trouble concentrating 0  Moving slowly or fidgety/restless 0  Suicidal thoughts 0  PHQ-9 Score 0      08/25/2021   10:04 AM  GAD 7 : Generalized Anxiety Score  Nervous, Anxious, on Edge 1  Control/stop worrying 0  Worry too much - different things 0  Trouble relaxing 0  Restless 0  Easily annoyed or irritable 0  Afraid - awful might happen 0  Total GAD 7 Score 1    Past Medical History:  Diagnosis Date   History of fracture of leg    as child   History of multiple miscarriages     Past Surgical History:  Procedure Laterality Date   BIOPSY Bilateral 12/11/2020   Procedure: BIOPSY of peritoneum;  Surgeon: Christeen Douglas, MD;  Location: [facility];  Service: Gynecology;  Laterality: Bilateral;   DILATION AND EVACUATION N/A 04/01/2020   Procedure: DILATATION AND  EVACUATION;  Surgeon: Christeen Douglas, MD;  Location: [facility];  Service: Gynecology;  Laterality: N/A;   FRACTURE SURGERY  1996   leg was wrapped in blanket as a child caused hairline fracture in leg   HYSTEROSCOPY WITH D & C N/A 12/11/2020   Procedure: DILATATION AND CURETTAGE /HYSTEROSCOPY;  Surgeon: Christeen Douglas, MD;  Location: [facility];  Service: Gynecology;  Laterality: N/A;   LAPAROSCOPY N/A 12/11/2020   Procedure: LAPAROSCOPY DIAGNOSTIC,  EXCISION OF UTERINE SEPTUM;  Surgeon: Christeen Douglas, MD;  Location: [facility];  Service: Gynecology;  Laterality: N/A;    Family History  Problem Relation Age of Onset   Alcohol abuse Father    Drug abuse Father    Diabetes Maternal Grandfather    Obesity Maternal Grandfather    Arthritis Maternal Grandmother    Asthma Maternal Grandmother    Obesity Maternal Grandmother    Stroke Maternal Grandmother    Obesity Maternal Aunt      Social History   Tobacco Use   Smoking status: Never   Smokeless tobacco: Never  Vaping Use   Vaping Use: Former  Substance Use Topics   Alcohol use: Yes   Drug use: No    No current outpatient medications on file prior  to visit.   No current facility-administered medications on file prior to visit.     Review of Systems  Constitutional:  Positive for fatigue. Negative for fever.  HENT: Negative.    Eyes: Negative.   Respiratory: Negative.    Cardiovascular: Negative.   Gastrointestinal: Negative.   Genitourinary: Negative.   Musculoskeletal: Negative.   Skin:        Breaking out with acne on her chin and cheeks  Neurological: Negative.   Psychiatric/Behavioral:  Negative for decreased concentration. The patient is nervous/anxious.       Objective:    BP 116/80   Pulse 75   Temp 97.8 F (36.6 C) (Temporal)   Wt 173 lb 9.6 oz (78.7 kg)   LMP 07/20/2021 (Exact Date)   SpO2 98%   BMI 27.19 kg/m   Wt Readings from Last 3 Encounters:  08/25/21 173 lb 9.6 oz (78.7 kg)  01/25/21  175 lb (79.4 kg)  12/11/20 174 lb (78.9 kg)    BP Readings from Last 3 Encounters:  08/25/21 116/80  01/25/21 111/70  12/11/20 111/70    Physical Exam Vitals and nursing note reviewed.  Constitutional:      General: She is not in acute distress.    Appearance: Normal appearance.  HENT:     Head: Normocephalic and atraumatic.     Right Ear: Tympanic membrane, ear canal and external ear normal.     Left Ear: Tympanic membrane, ear canal and external ear normal.  Eyes:     Conjunctiva/sclera: Conjunctivae normal.  Cardiovascular:     Rate and Rhythm: Normal rate and regular rhythm.     Pulses: Normal pulses.     Heart sounds: Normal heart sounds.  Pulmonary:     Effort: Pulmonary effort is normal.     Breath sounds: Normal breath sounds.  Abdominal:     Palpations: Abdomen is soft.     Tenderness: There is no abdominal tenderness.  Musculoskeletal:        General: Normal range of motion.     Cervical back: Normal range of motion and neck supple. No tenderness.     Right lower leg: No edema.     Left lower leg: No edema.  Lymphadenopathy:     Cervical: No cervical adenopathy.  Skin:    General: Skin is warm and dry.     Comments: Acne to chin and cheeks  Neurological:     General: No focal deficit present.     Mental Status: She is alert and oriented to person, place, and time.     Cranial Nerves: No cranial nerve deficit.     Coordination: Coordination normal.     Gait: Gait normal.  Psychiatric:        Mood and Affect: Mood normal.        Behavior: Behavior normal.        Thought Content: Thought content normal.        Judgment: Judgment normal.       Assessment & Plan:   Problem List Items Addressed This Visit       Musculoskeletal and Integument   Acne vulgaris - Primary    Referral placed to dermatology per patient request. She can use OTC benzoyl peroxide or salicylic acid which is safe during pregnancy.       Relevant Orders   Ambulatory referral to  Dermatology   Other Visit Diagnoses     Less than [redacted] weeks gestation of pregnancy  Encouraged her to start prenatal vitamin daily and schedule an appointment with OBGYN. LIst of OTC medications safe in pregnancy provided.    Amenorrhea       Positive pregnancy test. Reach out GYN to schedule appointment. Start prenatal vitamin daily. She does have a history of multiple miscarriages before 9 weeks.    Relevant Orders   POCT urine pregnancy (Completed)       Follow up plan: No follow-ups on file.

## 2021-08-25 NOTE — Assessment & Plan Note (Signed)
Referral placed to dermatology per patient request. She can use OTC benzoyl peroxide or salicylic acid which is safe during pregnancy.

## 2021-08-25 NOTE — Patient Instructions (Signed)
It was great to see you!  I have placed a referral to dermatology. Start a prenatal vitamin daily.   Let's follow-up in 1 year, sooner if you have concerns.  If a referral was placed today, you will be contacted for an appointment. Please note that routine referrals can sometimes take up to 3-4 weeks to process. Please call our office if you haven't heard anything after this time frame.  Take care,  Rodman Pickle, NP   Safe Medications in Pregnancy   Acne: Benzoyl Peroxide Salicylic Acid  Backache/Headache: Tylenol: 2 regular strength every 4 hours OR              2 Extra strength every 6 hours  Colds/Coughs/Allergies: Benadryl (alcohol free) 25 mg every 6 hours as needed Breath right strips Claritin Cepacol throat lozenges Chloraseptic throat spray Cold-Eeze- up to three times per day Cough drops, alcohol free Flonase (by prescription only) Guaifenesin Mucinex Robitussin DM (plain only, alcohol free) Saline nasal spray/drops Sudafed (pseudoephedrine) & Actifed ** use only after [redacted] weeks gestation and if you do not have high blood pressure Tylenol Vicks Vaporub Zinc lozenges Zyrtec   Constipation: Colace Ducolax suppositories Fleet enema Glycerin suppositories Metamucil Milk of magnesia Miralax Senokot Smooth move tea  Diarrhea: Kaopectate Imodium A-D  *NO pepto Bismol  Hemorrhoids: Anusol Anusol HC Preparation H Tucks  Indigestion: Tums Maalox Mylanta Zantac  Pepcid  Insomnia: Benadryl (alcohol free) 25mg  every 6 hours as needed Tylenol PM Unisom, no Gelcaps  Leg Cramps: Tums MagGel  Nausea/Vomiting:  Bonine Dramamine Emetrol Ginger extract Sea bands Meclizine  Nausea medication to take during pregnancy:  Unisom (doxylamine succinate 25 mg tablets) Take one tablet daily at bedtime. If symptoms are not adequately controlled, the dose can be increased to a maximum recommended dose of two tablets daily (1/2 tablet in the morning,  1/2 tablet mid-afternoon and one at bedtime). Vitamin B6 100mg  tablets. Take one tablet twice a day (up to 200 mg per day).  Skin Rashes: Aveeno products Benadryl cream or 25mg  every 6 hours as needed Calamine Lotion 1% cortisone cream  Yeast infection: Gyne-lotrimin 7 Monistat 7   **If taking multiple medications, please check labels to avoid duplicating the same active ingredients **take medication as directed on the label ** Do not exceed 4000 mg of tylenol in 24 hours **Do not take medications that contain aspirin or ibuprofen

## 2021-09-27 DIAGNOSIS — Z114 Encounter for screening for human immunodeficiency virus [HIV]: Secondary | ICD-10-CM | POA: Diagnosis not present

## 2021-09-27 DIAGNOSIS — O3680X0 Pregnancy with inconclusive fetal viability, not applicable or unspecified: Secondary | ICD-10-CM | POA: Diagnosis not present

## 2021-09-27 DIAGNOSIS — Z113 Encounter for screening for infections with a predominantly sexual mode of transmission: Secondary | ICD-10-CM | POA: Diagnosis not present

## 2021-09-27 DIAGNOSIS — Z124 Encounter for screening for malignant neoplasm of cervix: Secondary | ICD-10-CM | POA: Diagnosis not present

## 2021-09-27 DIAGNOSIS — Z3481 Encounter for supervision of other normal pregnancy, first trimester: Secondary | ICD-10-CM | POA: Diagnosis not present

## 2021-09-27 LAB — OB RESULTS CONSOLE RUBELLA ANTIBODY, IGM: Rubella: IMMUNE

## 2021-09-27 LAB — HEPATITIS C ANTIBODY: HCV Ab: NEGATIVE

## 2021-09-27 LAB — OB RESULTS CONSOLE HIV ANTIBODY (ROUTINE TESTING): HIV: NONREACTIVE

## 2021-09-27 LAB — OB RESULTS CONSOLE RPR: RPR: NONREACTIVE

## 2021-09-27 LAB — OB RESULTS CONSOLE VARICELLA ZOSTER ANTIBODY, IGG: Varicella: IMMUNE

## 2021-09-27 LAB — OB RESULTS CONSOLE HEPATITIS B SURFACE ANTIGEN: Hepatitis B Surface Ag: NEGATIVE

## 2021-09-29 DIAGNOSIS — L700 Acne vulgaris: Secondary | ICD-10-CM | POA: Diagnosis not present

## 2021-10-01 DIAGNOSIS — R7309 Other abnormal glucose: Secondary | ICD-10-CM | POA: Diagnosis not present

## 2021-10-01 DIAGNOSIS — Z3481 Encounter for supervision of other normal pregnancy, first trimester: Secondary | ICD-10-CM | POA: Diagnosis not present

## 2021-10-11 DIAGNOSIS — Z3481 Encounter for supervision of other normal pregnancy, first trimester: Secondary | ICD-10-CM | POA: Diagnosis not present

## 2021-11-17 DIAGNOSIS — Z3482 Encounter for supervision of other normal pregnancy, second trimester: Secondary | ICD-10-CM | POA: Diagnosis not present

## 2021-12-15 DIAGNOSIS — Z3482 Encounter for supervision of other normal pregnancy, second trimester: Secondary | ICD-10-CM | POA: Diagnosis not present

## 2021-12-29 DIAGNOSIS — L700 Acne vulgaris: Secondary | ICD-10-CM | POA: Diagnosis not present

## 2021-12-29 IMAGING — US US OB < 14 WEEKS - US OB TV
1 series · 14 of 28 positions shown · non-contrast
Comparison: None.

CLINICAL DATA: Bleeding

EXAM:
OBSTETRIC <14 WK US AND TRANSVAGINAL OB US
TECHNIQUE: Both transabdominal and transvaginal ultrasound examinations were
performed for complete evaluation of the gestation as well as the
maternal uterus, adnexal regions, and pelvic cul-de-sac.
Transvaginal technique was performed to assess early pregnancy.

[Series 1: us ob comp less 14 wks · 32 acquisitions, 14 frames shown]
[im 2/32]
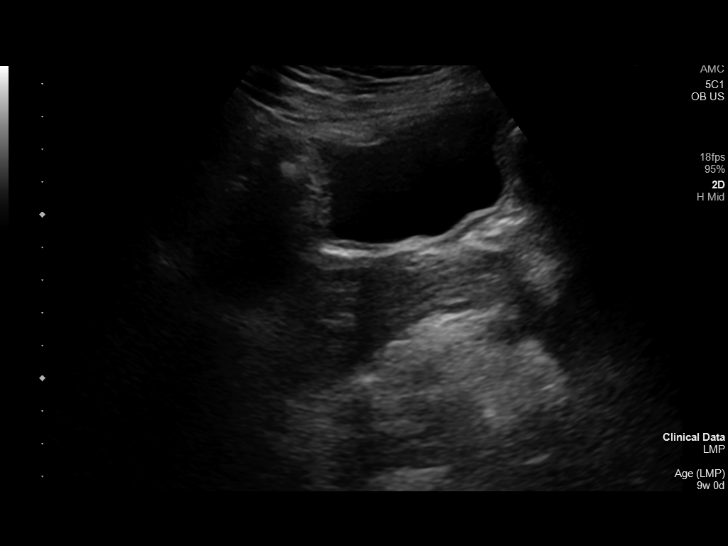
[im 4/32]
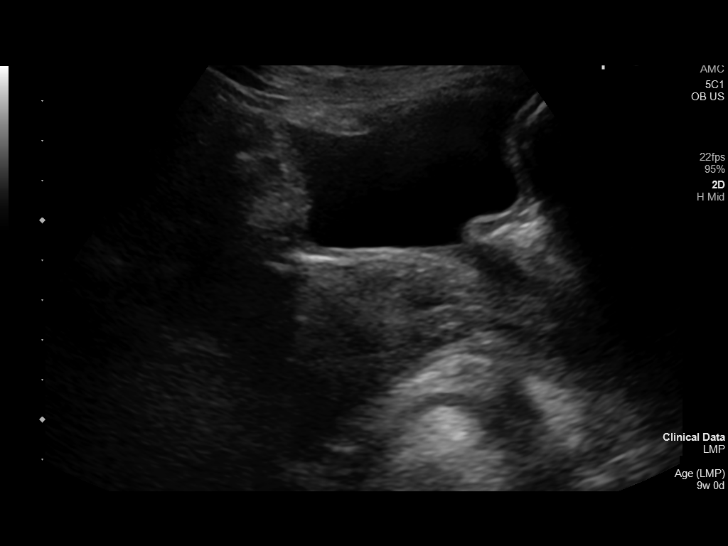
[im 6/32]
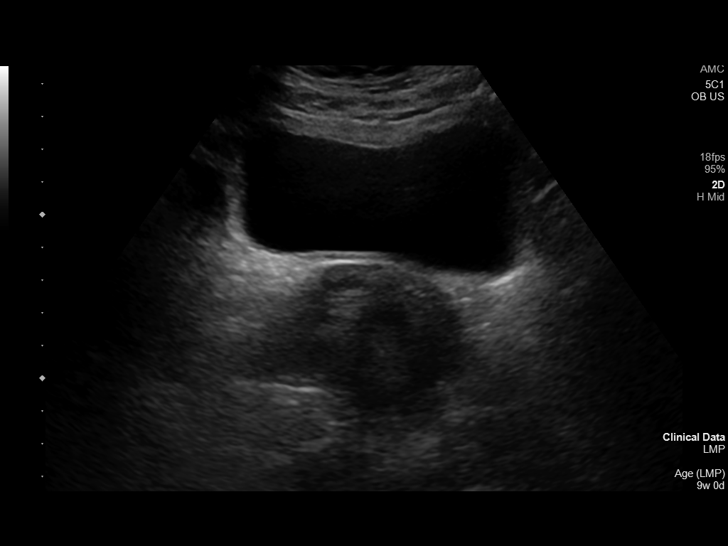
[im 9/32]
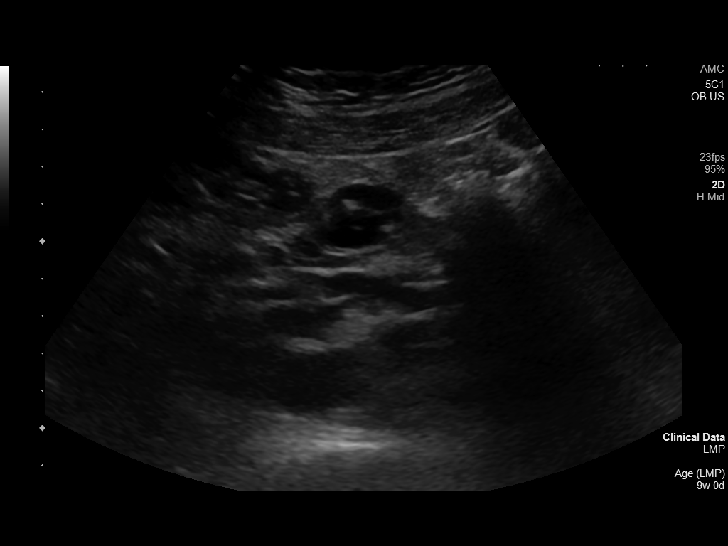
[im 11/32]
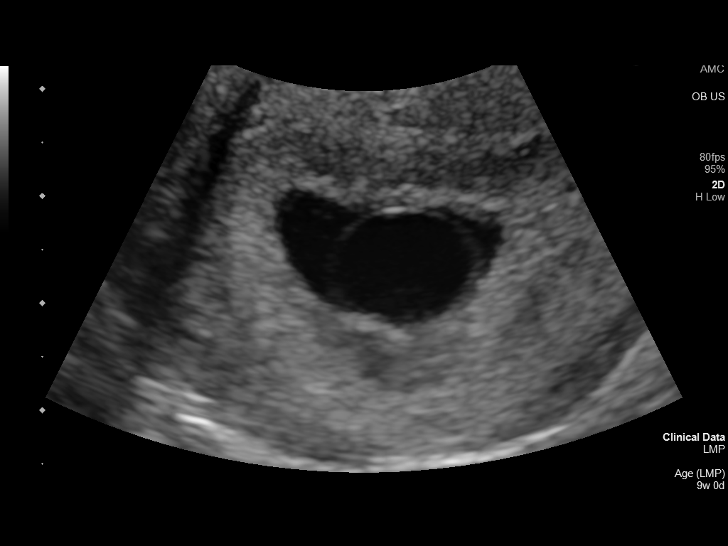
[im 13/32]
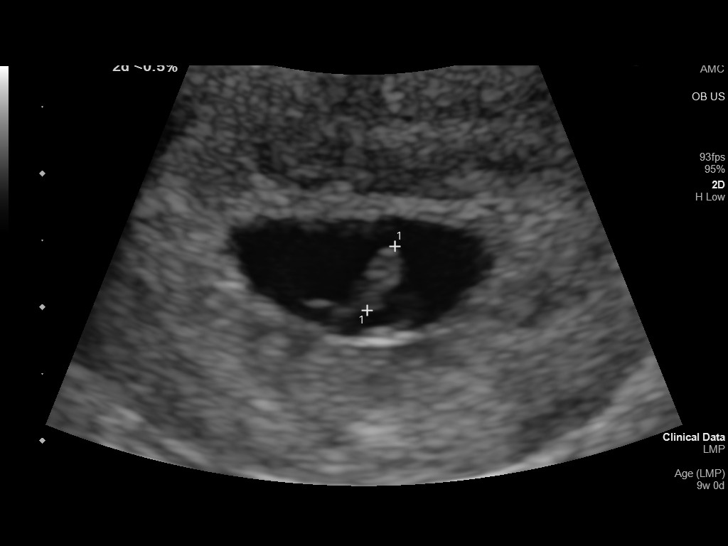
[im 15/32]
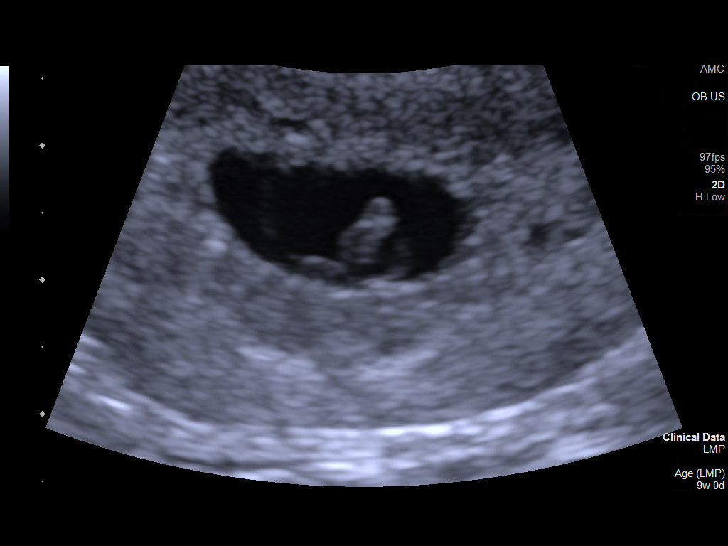
[im 18/32]
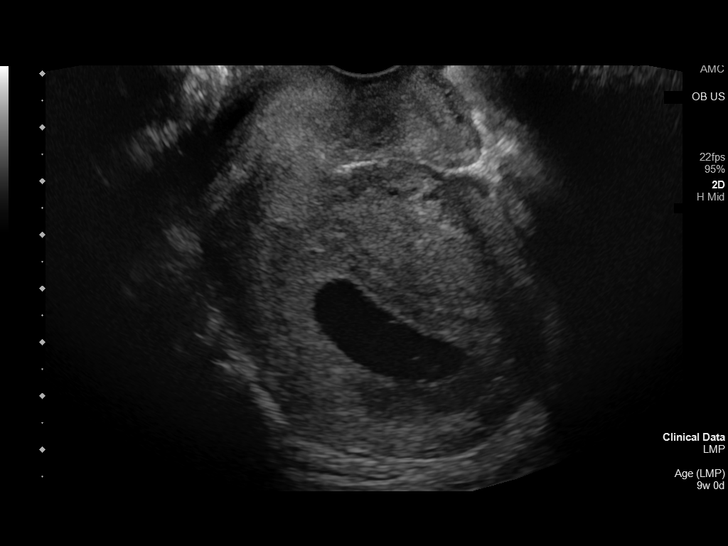
[im 20/32]
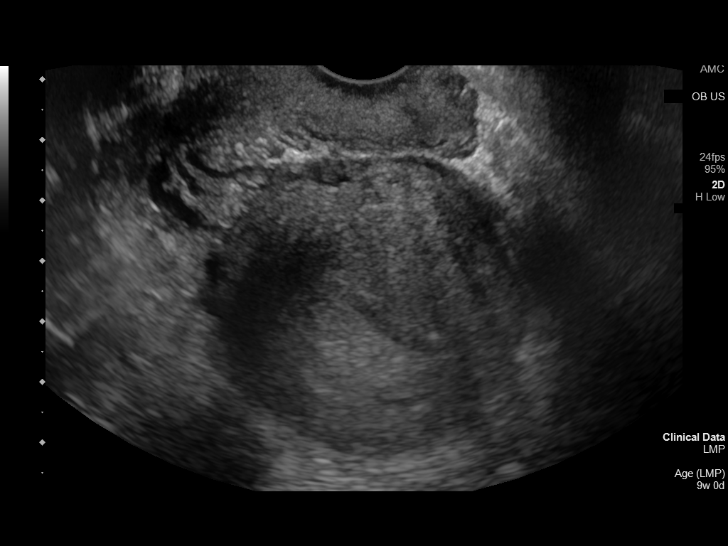
[im 22/32]
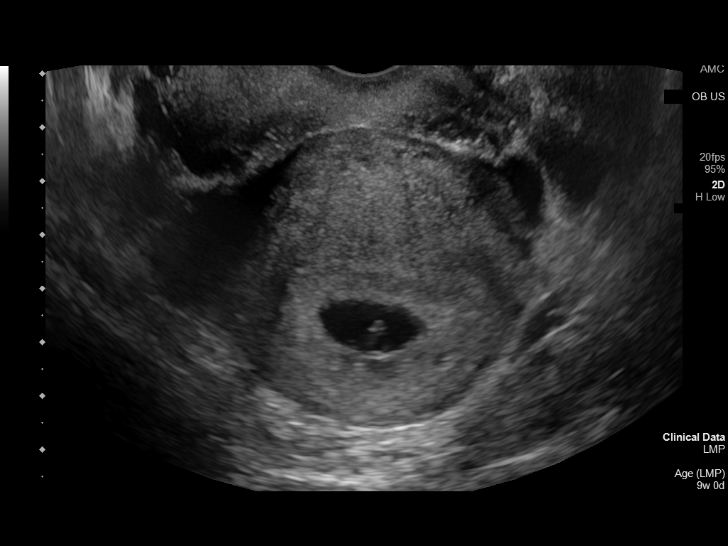
[im 25/32]
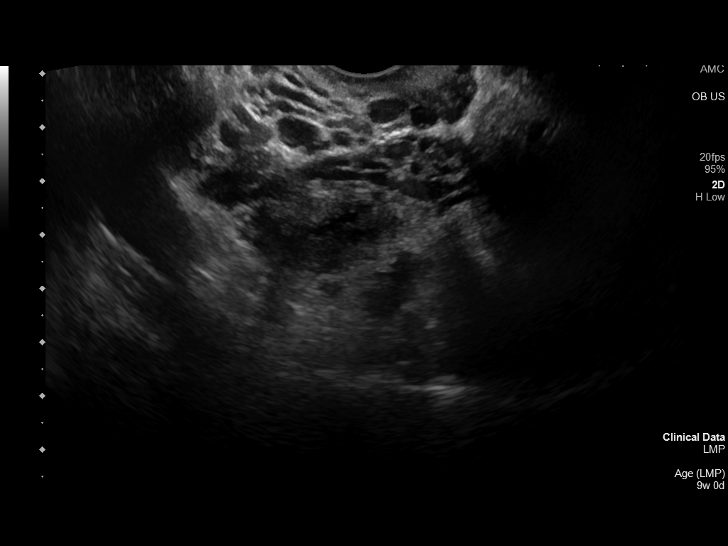
[im 27/32]
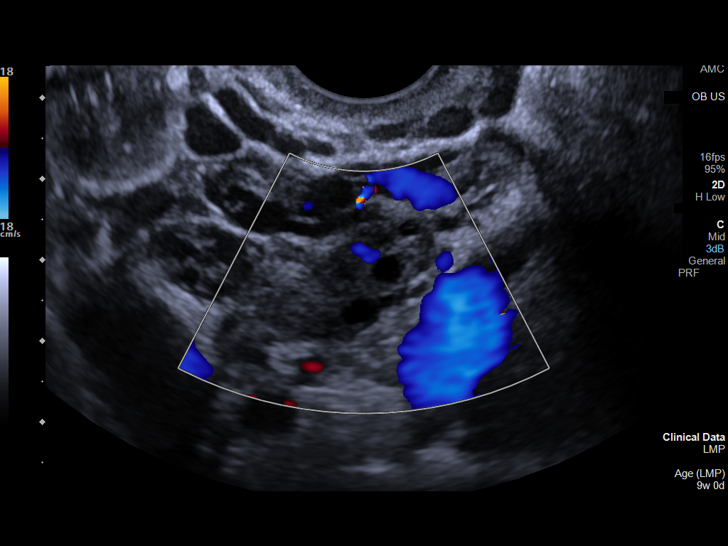
[im 29/32]
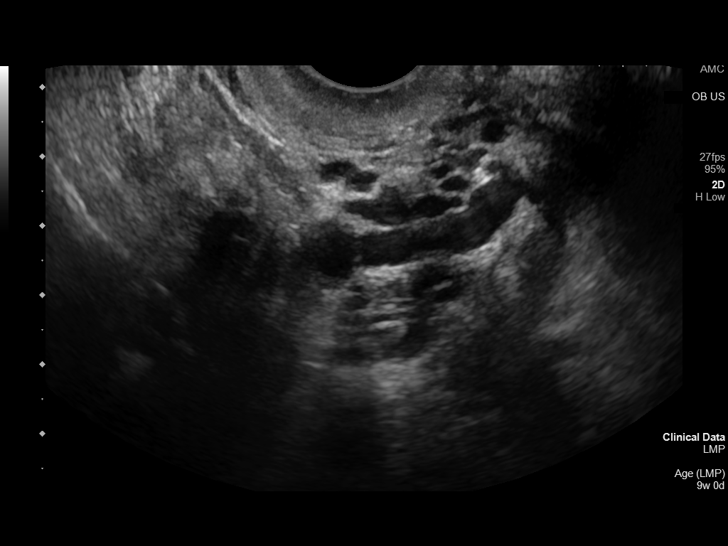
[im 32/32]
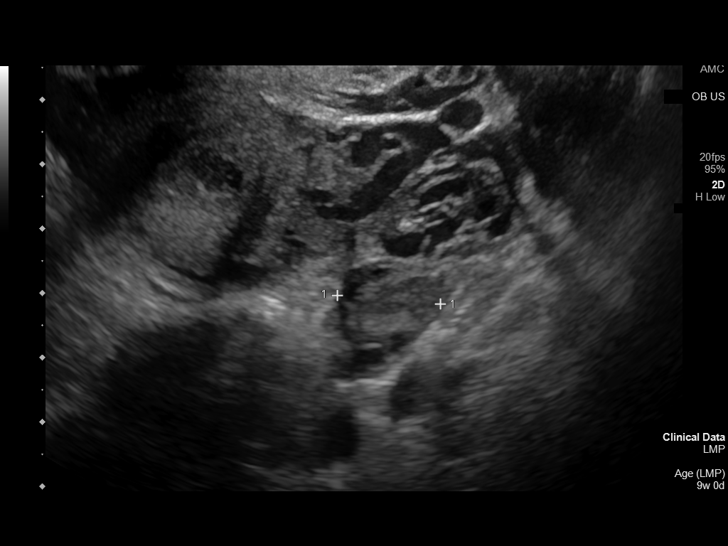

[14 of 28 positions shown; findings below may reference images not displayed]

FINDINGS: Intrauterine gestational sac: Single

Yolk sac:  Visualized.

Embryo:  Visualized.

Cardiac Activity: Visualized.

Heart Rate: Unable to determine heart rate by M mode. Heart rate too
slow.

MSD:   mm    w     d

CRL:  5.2 mm   6 w   2 d                  US EDC: 11/19/2020

Subchorionic hemorrhage:  None visualized.

Maternal uterus/adnexae: No adnexal mass or free fluid.
IMPRESSION: Six week 2 day intrauterine pregnancy. Fetal heart tones seen
visually, but unable to measure by M mode. Fetal bradycardia. This
may be related to early gestational age. This could be followed with
repeat ultrasound in 14 days to ensure expected progression

## 2022-02-11 ENCOUNTER — Ambulatory Visit
Admission: EM | Admit: 2022-02-11 | Discharge: 2022-02-11 | Disposition: A | Discharge status: Home / Self Care | Payer: BC Managed Care – PPO | Attending: Physician Assistant | Admitting: Physician Assistant

## 2022-02-11 DIAGNOSIS — J069 Acute upper respiratory infection, unspecified: Secondary | ICD-10-CM | POA: Diagnosis not present

## 2022-02-11 DIAGNOSIS — Z1152 Encounter for screening for COVID-19: Secondary | ICD-10-CM

## 2022-02-11 NOTE — ED Provider Notes (Signed)
[facility]    CSN: [id number] Arrival date & time: 02/11/22  1056      History   Chief Complaint Chief Complaint  Patient presents with   Sore Throat   Cough    HPI Sharon Houston is a 27 y.o. female.   Patient here today for evaluation of throat, cough, chest congestion that started yesterday.  She states that her husband has similar symptoms.  She has not had any fever.  She denies any vomiting or diarrhea.  She has tried using Robitussin with mild relief.  The history is provided by the patient.    Past Medical History:  Diagnosis Date   History of fracture of leg    as child   History of multiple miscarriages     Patient Active Problem List   Diagnosis Date Noted   Acne vulgaris 08/25/2021    Past Surgical History:  Procedure Laterality Date   BIOPSY Bilateral 12/11/2020   Procedure: BIOPSY of peritoneum;  Surgeon: Benjaman Kindler, MD;  Location: [facility];  Service: Gynecology;  Laterality: Bilateral;   DILATION AND EVACUATION N/A 04/01/2020   Procedure: DILATATION AND EVACUATION;  Surgeon: Benjaman Kindler, MD;  Location: [facility];  Service: Gynecology;  Laterality: N/A;   FRACTURE SURGERY  1996   leg was wrapped in blanket as a child caused hairline fracture in leg   HYSTEROSCOPY WITH D & C N/A 12/11/2020   Procedure: DILATATION AND CURETTAGE /HYSTEROSCOPY;  Surgeon: Benjaman Kindler, MD;  Location: [facility];  Service: Gynecology;  Laterality: N/A;   LAPAROSCOPY N/A 12/11/2020   Procedure: LAPAROSCOPY DIAGNOSTIC,  EXCISION OF UTERINE SEPTUM;  Surgeon: Benjaman Kindler, MD;  Location: [facility];  Service: Gynecology;  Laterality: N/A;    OB History     Gravida  1   Para      Term      Preterm      AB      Living         SAB      IAB      Ectopic      Multiple      Live Births               Home Medications    Prior to Admission medications   Not on File    Family History Family History  Problem Relation Age  of Onset   Alcohol abuse Father    Drug abuse Father    Diabetes Maternal Grandfather    Obesity Maternal Grandfather    Arthritis Maternal Grandmother    Asthma Maternal Grandmother    Obesity Maternal Grandmother    Stroke Maternal Grandmother    Obesity Maternal Aunt     Social History Social History   Tobacco Use   Smoking status: Never   Smokeless tobacco: Never  Vaping Use   Vaping Use: Former  Substance Use Topics   Alcohol use: Yes   Drug use: No     Allergies   Patient has no known allergies.   Review of Systems Review of Systems  Constitutional:  Negative for chills and fever.  HENT:  Positive for congestion and sore throat. Negative for ear pain.   Eyes:  Negative for discharge and redness.  Respiratory:  Positive for cough. Negative for shortness of breath and wheezing.   Gastrointestinal:  Negative for abdominal pain, diarrhea, nausea and vomiting.     Physical Exam Triage Vital Signs ED Triage Vitals  Enc Vitals Group  BP      Pulse      Resp      Temp      Temp src      SpO2      Weight      Height      Head Circumference      Peak Flow      Pain Score      Pain Loc      Pain Edu?      Excl. in [place]?    No data found.  Updated Vital Signs BP 113/68 (BP Location: Left Arm)   Pulse 74   Temp 98 F (36.7 C) (Oral)   Resp 16   LMP 07/20/2021 (Exact Date)   SpO2 98%      Physical Exam Vitals and nursing note reviewed.  Constitutional:      General: She is not in acute distress.    Appearance: Normal appearance. She is not ill-appearing.  HENT:     Head: Normocephalic and atraumatic.     Nose: Congestion present.     Mouth/Throat:     Mouth: Mucous membranes are moist.     Pharynx: No oropharyngeal exudate or posterior oropharyngeal erythema.  Eyes:     Conjunctiva/sclera: Conjunctivae normal.  Cardiovascular:     Rate and Rhythm: Normal rate and regular rhythm.     Heart sounds: Normal heart sounds. No murmur  heard. Pulmonary:     Effort: Pulmonary effort is normal. No respiratory distress.     Breath sounds: Normal breath sounds. No wheezing, rhonchi or rales.  Skin:    General: Skin is warm and dry.  Neurological:     Mental Status: She is alert.  Psychiatric:        Mood and Affect: Mood normal.        Thought Content: Thought content normal.      UC Treatments / Results  Labs (all labs ordered are listed, but only abnormal results are displayed) Labs Reviewed  SARS CORONAVIRUS 2 (TAT 6-24 HRS)    EKG   Radiology No results found.  Procedures Procedures (including critical care time)  Medications Ordered in UC Medications - No data to display  Initial Impression / Assessment and Plan / UC Course  I have reviewed the triage vital signs and the nursing notes.  Pertinent labs & imaging results that were available during my care of the patient were reviewed by me and considered in my medical decision making (see chart for details).   Suspect viral etiology of symptoms. Will screen for covid. Recommend symptomatic treatment as needed with increased fluids and rest and follow up with any further concerns.   Final Clinical Impressions(s) / UC Diagnoses   Final diagnoses:  Acute upper respiratory infection  Encounter for screening for COVID-19   Discharge Instructions   None    ED Prescriptions   None    PDMP not reviewed this encounter.   Francene Finders, PA-C 02/11/22 1241

## 2022-02-11 NOTE — ED Triage Notes (Signed)
Patient presents to UC for sore throat, cough, chest congestion since yesterday. Treating with Robitussin.   Denies fever.

## 2022-02-12 LAB — SARS CORONAVIRUS 2 (TAT 6-24 HRS): SARS Coronavirus 2: NEGATIVE

## 2022-02-16 DIAGNOSIS — O4442 Low lying placenta NOS or without hemorrhage, second trimester: Secondary | ICD-10-CM | POA: Diagnosis not present

## 2022-02-16 DIAGNOSIS — Z3482 Encounter for supervision of other normal pregnancy, second trimester: Secondary | ICD-10-CM | POA: Diagnosis not present

## 2022-02-28 DIAGNOSIS — Z23 Encounter for immunization: Secondary | ICD-10-CM | POA: Diagnosis not present

## 2022-02-28 DIAGNOSIS — O99013 Anemia complicating pregnancy, third trimester: Secondary | ICD-10-CM | POA: Diagnosis not present

## 2022-03-28 DIAGNOSIS — Z3483 Encounter for supervision of other normal pregnancy, third trimester: Secondary | ICD-10-CM | POA: Diagnosis not present

## 2022-03-28 DIAGNOSIS — Z114 Encounter for screening for human immunodeficiency virus [HIV]: Secondary | ICD-10-CM | POA: Diagnosis not present

## 2022-03-28 DIAGNOSIS — O329XX0 Maternal care for malpresentation of fetus, unspecified, not applicable or unspecified: Secondary | ICD-10-CM | POA: Diagnosis not present

## 2022-03-28 LAB — OB RESULTS CONSOLE GBS: GBS: NEGATIVE

## 2022-04-04 NOTE — H&P (Signed)
Preoperative History and Physical  Sharon Houston is a 27 y.o. G1P0 here for surgical management of malpresentation of fetus.   No significant preoperative concerns.  History of Present Illness: 27 y.o. [id number] at [redacted]w[redacted]d with good pregnancy dates. Her pregnancy has been complicated by a breech presentation. Today, she notes +FM, no LOF, no vaginal bleeding.  She has previously been counseled regarding external cephalic version and has declined.   Proposed surgery: Primary Cesarean Section  Past Medical History:  Diagnosis Date   History of fracture of leg    as child   History of multiple miscarriages    Past Surgical History:  Procedure Laterality Date   BIOPSY Bilateral 12/11/2020   Procedure: BIOPSY of peritoneum;  Surgeon: Benjaman Kindler, MD;  Location: [facility] ORS;  Service: Gynecology;  Laterality: Bilateral;   DILATION AND EVACUATION N/A 04/01/2020   Procedure: DILATATION AND EVACUATION;  Surgeon: Benjaman Kindler, MD;  Location: [facility] ORS;  Service: Gynecology;  Laterality: N/A;   FRACTURE SURGERY  1996   leg was wrapped in blanket as a child caused hairline fracture in leg   HYSTEROSCOPY WITH D & C N/A 12/11/2020   Procedure: DILATATION AND CURETTAGE /HYSTEROSCOPY;  Surgeon: Benjaman Kindler, MD;  Location: [facility] ORS;  Service: Gynecology;  Laterality: N/A;   LAPAROSCOPY N/A 12/11/2020   Procedure: LAPAROSCOPY DIAGNOSTIC,  EXCISION OF UTERINE SEPTUM;  Surgeon: Benjaman Kindler, MD;  Location: [facility] ORS;  Service: Gynecology;  Laterality: N/A;   OB History  Gravida Para Term Preterm AB Living  1            SAB IAB Ectopic Multiple Live Births               # Outcome Date GA Lbr Len/2nd Weight Sex Delivery Anes PTL Lv  1 Current           Patient denies any other pertinent gynecologic issues.   No current facility-administered medications on file prior to encounter.   No current outpatient medications on file prior to encounter.   No Known Allergies  Social History:    reports that she has never smoked. She has never used smokeless tobacco. She reports current alcohol use. She reports that she does not use drugs.  Family History  Problem Relation Age of Onset   Alcohol abuse Father    Drug abuse Father    Diabetes Maternal Grandfather    Obesity Maternal Grandfather    Arthritis Maternal Grandmother    Asthma Maternal Grandmother    Obesity Maternal Grandmother    Stroke Maternal Grandmother    Obesity Maternal Aunt     Review of Systems: Noncontributory  PHYSICAL EXAM: BP 112/72   Pulse 82   Ht 170.2 cm (5' 7.01")   Wt 91.7 kg (202 lb 3.2 oz)   LMP 07/20/2021   BMI 31.66 kg/m   Pregravid weight 81.2 kg (179 lb) Total Weight Gain 10.5 kg (23 lb 3.2 oz) CONSTITUTIONAL: Well-developed, well-nourished female in no acute distress.  HENT:  Normocephalic, atraumatic, External right and left ear normal. Oropharynx is clear and moist EYES: Conjunctivae and EOM are normal. Pupils are equal, round, and reactive to light. No scleral icterus.  NECK: Normal range of motion, supple, no masses SKIN: Skin is warm and dry. No rash noted. Not diaphoretic. No erythema. No pallor. [place]: Alert and oriented to person, place, and time. Normal reflexes, muscle tone coordination. No cranial nerve deficit noted. PSYCHIATRIC: Normal mood and affect. Normal behavior. Normal judgment and thought content.  CARDIOVASCULAR: Normal heart rate noted, regular rhythm RESPIRATORY: Effort and breath sounds normal, no problems with respiration noted ABDOMEN: Soft, nontender, nondistended. PELVIC: Deferred MUSCULOSKELETAL: Normal range of motion. No edema and no tenderness. 2+ distal pulses.  Labs: No results found for this or any previous visit (from the past 336 hour(s)).  Imaging Studies: No results found.  Assessment: Supervision of other normal pregnancy Malpresentation of fetus.   Plan: Patient will undergo surgical management with the above surgery.   The  risks of surgery were discussed in detail with the patient including but not limited to: bleeding which may require transfusion or reoperation; infection which may require antibiotics; injury to surrounding organs which may involve bowel, bladder, ureters ; need for additional procedures including laparoscopy or laparotomy; thromboembolic phenomenon, surgical site problems and other postoperative/anesthesia complications. Likelihood of success in alleviating the patient's condition was discussed. Routine postoperative instructions will be reviewed with the patient and her family in detail after surgery.  The patient concurred with the proposed plan, giving informed written consent for the surgery.  Preoperative prophylactic antibiotics, as indicated, and SCDs ordered on call to the OR.    Prentice Docker, MD 04/04/2022 1:14 PM

## 2022-04-15 ENCOUNTER — Encounter
Admission: RE | Admit: 2022-04-15 | Discharge: 2022-04-15 | Disposition: A | Discharge status: Home / Self Care | Payer: Medicaid Other | Source: Ambulatory Visit | Attending: Obstetrics and Gynecology | Admitting: Obstetrics and Gynecology

## 2022-04-15 ENCOUNTER — Other Ambulatory Visit: Payer: Self-pay

## 2022-04-15 NOTE — Patient Instructions (Addendum)
Your procedure is scheduled on: 04/22/2022   Arrival Time: Please call Labor and Delivery the day before your scheduled C-Section to find out your arrival time. [phone].  Arrival: If your arrival time is prior to 6:00 am, please enter through the Emergency Room Entrance and you will be directed to Labor and Delivery. If your arrival time is 6:00 am or later, please enter the Medical Mall and follow the greeter's instructions.  REMEMBER: Instructions that are not followed completely may result in serious medical risk, up to and including death; or upon the discretion of your surgeon and anesthesiologist your surgery may need to be rescheduled.  Do not eat food after midnight the night before surgery.  No gum chewing or hard candies.    One week prior to surgery: Stop Anti-inflammatories (NSAIDS) such as Advil, Aleve, Ibuprofen, Motrin, Naproxen, Naprosyn and Aspirin based products such as Excedrin, Goody's Powder, BC Powder. Stop ANY OVER THE COUNTER supplements until after surgery. You may however, continue to take Tylenol if needed for pain up until the day of surgery.  Continue taking all prescribed medications with the exception of the following mentioned above.  TAKE ONLY THESE MEDICATIONS THE MORNING OF SURGERY WITH A SIP OF WATER:  Pre -natal vitamin   No Alcohol for 24 hours before or after surgery.  No Smoking including e-cigarettes for 24 hours prior to surgery.  No chewable tobacco products for at least 6 hours prior to surgery.  No nicotine patches on the day of surgery.  Do not use any "recreational" drugs for at least a week prior to your surgery.  Please be advised that the combination of cocaine and anesthesia may have negative outcomes, up to and including death. If you test positive for cocaine, your surgery will be cancelled.  On the morning of surgery brush your teeth with toothpaste and water, you may rinse your mouth with mouthwash if you wish. Do not  swallow any toothpaste or mouthwash.  Use CHG wipes as directed on instruction sheet.- Do not wipe your BREAST AREA   Do not wear jewelry, make-up, hairpins, clips or nail polish.  Do not wear lotions, powders, or perfumes.   Do not shave body hair from the neck down 48 hours before surgery.  Contact lenses, hearing aids and dentures may not be worn into surgery.  Do not bring valuables to the hospital. [facility] is not responsible for any missing/lost belongings or valuables.   Notify your doctor if there is any change in your medical condition (cold, fever, infection).  Wear comfortable clothing (specific to your surgery type) to the hospital.  After surgery, you can help prevent lung complications by doing breathing exercises.  Take deep breaths and cough every 1-2 hours. Your doctor may order a device called an Incentive Spirometer to help you take deep breaths.  Please call the Pre-admissions Testing Dept. at [phone] if you have any questions about these instructions.  Surgery Visitation Policy:  Visitor Passes   All visitors, including children, need an identification sticker when visiting. These stickers must be worn where they can be seen.   Labor & Delivery  Laboring women may have one designated support person and two other visitors of any age visit. The support person must remain the same. The visitors may switch with other visitors. Visitation is permitted 24 hours per day. The designated support person or a visitor over the age of 16 may sleep overnight in the patient's room. A doula registered with [facility] for labor and delivery support is not considered a visitor. Doulas not registered with [place] are considered visitors.  Mother Baby Unit, OB Specialty and Gynecological Care  A designated support person and three visitors of any age may visit. The three visitors may switch out. The designated support person or a visitor age 24 or older may  stay overnight in the room. During the postpartum period (up to 6 weeks), if the mother is the patient, she can have her newborn stay with her if there is another support person present who can be responsible for the baby.  Preparing the Skin Before Surgery     To help prevent the risk of infection at your surgical site, we are now providing you with rinse-free Sage 2% Chlorhexidine Gluconate (CHG) disposable wipes.  Chlorhexidine Gluconate (CHG) Soap  o An antiseptic cleaner that kills germs and bonds with the skin to continue killing germs even after washing  o Used after showering the night before surgery and morning of surgery.  The night before surgery: Shower or bathe with warm water. Do not apply perfume, lotions, powders. Wait one hour after shower. Skin should be dry and cool. Open Sage wipe package - use 6 disposable cloths. Wipe body using one cloth for the right arm, one cloth for the left arm, one cloth for the right leg, one cloth for the left leg, one cloth for the chest/abdomen area, and one cloth for the back. Do not use on open wounds or sores. Do not use on face or genitals (private parts). If you are breast feeding, do not use on breasts. 5. Do not rinse, allow to dry. 6. Skin may feel "tacky" for several minutes. 7. Dress in clean clothes. 8. Place clean sheets on your bed and do not sleep with pets.  REPEAT ABOVE ON THE MORNING OF SURGERY BEFORE ARRIVING TO THE HOSPITAL.

## 2022-04-18 ENCOUNTER — Encounter
Admission: RE | Admit: 2022-04-18 | Discharge: 2022-04-18 | Disposition: A | Discharge status: Home / Self Care | Payer: BC Managed Care – PPO | Source: Ambulatory Visit | Attending: Obstetrics and Gynecology | Admitting: Obstetrics and Gynecology

## 2022-04-18 ENCOUNTER — Encounter: Payer: Self-pay | Admitting: Urgent Care

## 2022-04-18 DIAGNOSIS — Z01812 Encounter for preprocedural laboratory examination: Secondary | ICD-10-CM | POA: Diagnosis not present

## 2022-04-18 DIAGNOSIS — O329XX0 Maternal care for malpresentation of fetus, unspecified, not applicable or unspecified: Secondary | ICD-10-CM | POA: Diagnosis not present

## 2022-04-18 LAB — CBC
HCT: 32 % — ABNORMAL LOW (ref 36.0–46.0)
Hemoglobin: 10.6 g/dL — ABNORMAL LOW (ref 12.0–15.0)
MCH: 29.8 pg (ref 26.0–34.0)
MCHC: 33.1 g/dL (ref 30.0–36.0)
MCV: 89.9 fL (ref 80.0–100.0)
Platelets: 212 10*3/uL (ref 150–400)
RBC: 3.56 MIL/uL — ABNORMAL LOW (ref 3.87–5.11)
RDW: 13.1 % (ref 11.5–15.5)
WBC: 7.2 10*3/uL (ref 4.0–10.5)
nRBC: 0 % (ref 0.0–0.2)

## 2022-04-18 LAB — RAPID HIV SCREEN (HIV 1/2 AB+AG)
HIV 1/2 Antibodies: NONREACTIVE
HIV-1 P24 Antigen - HIV24: NONREACTIVE

## 2022-04-19 LAB — RPR: RPR Ser Ql: NONREACTIVE

## 2022-04-21 LAB — TYPE AND SCREEN
ABO/RH(D): O POS
Antibody Screen: NEGATIVE
Extend sample reason: UNDETERMINED

## 2022-04-22 ENCOUNTER — Inpatient Hospital Stay: Payer: BC Managed Care – PPO | Admitting: Anesthesiology

## 2022-04-22 ENCOUNTER — Encounter
Admission: RE | Disposition: A | Discharge status: Home / Self Care | Payer: Self-pay | Source: Home / Self Care | Attending: Obstetrics and Gynecology

## 2022-04-22 ENCOUNTER — Other Ambulatory Visit: Payer: Self-pay

## 2022-04-22 ENCOUNTER — Inpatient Hospital Stay
Admission: RE | Admit: 2022-04-22 | Discharge: 2022-04-24 | DRG: 788 | Disposition: A | Discharge status: Home / Self Care | Payer: BC Managed Care – PPO | Attending: Obstetrics and Gynecology | Admitting: Obstetrics and Gynecology

## 2022-04-22 ENCOUNTER — Encounter: Payer: Self-pay | Admitting: Obstetrics and Gynecology

## 2022-04-22 DIAGNOSIS — O9902 Anemia complicating childbirth: Secondary | ICD-10-CM | POA: Diagnosis not present

## 2022-04-22 DIAGNOSIS — O329XX0 Maternal care for malpresentation of fetus, unspecified, not applicable or unspecified: Secondary | ICD-10-CM

## 2022-04-22 DIAGNOSIS — O321XX0 Maternal care for breech presentation, not applicable or unspecified: Principal | ICD-10-CM | POA: Diagnosis present

## 2022-04-22 DIAGNOSIS — Z3480 Encounter for supervision of other normal pregnancy, unspecified trimester: Secondary | ICD-10-CM

## 2022-04-22 DIAGNOSIS — Z23 Encounter for immunization: Secondary | ICD-10-CM | POA: Diagnosis not present

## 2022-04-22 DIAGNOSIS — Z3A39 39 weeks gestation of pregnancy: Secondary | ICD-10-CM

## 2022-04-22 DIAGNOSIS — O9081 Anemia of the puerperium: Secondary | ICD-10-CM | POA: Diagnosis not present

## 2022-04-22 LAB — CBC
HCT: 32.8 % — ABNORMAL LOW (ref 36.0–46.0)
Hemoglobin: 10.7 g/dL — ABNORMAL LOW (ref 12.0–15.0)
MCH: 29.3 pg (ref 26.0–34.0)
MCHC: 32.6 g/dL (ref 30.0–36.0)
MCV: 89.9 fL (ref 80.0–100.0)
Platelets: 199 10*3/uL (ref 150–400)
RBC: 3.65 MIL/uL — ABNORMAL LOW (ref 3.87–5.11)
RDW: 13 % (ref 11.5–15.5)
WBC: 9.3 10*3/uL (ref 4.0–10.5)
nRBC: 0 % (ref 0.0–0.2)

## 2022-04-22 LAB — TYPE AND SCREEN
ABO/RH(D): O POS
Antibody Screen: NEGATIVE

## 2022-04-22 SURGERY — Surgical Case
Anesthesia: Spinal

## 2022-04-22 MED ORDER — NALOXONE HCL 4 MG/10ML IJ SOLN: 1.0000 ug/kg/h | INTRAVENOUS | Status: DC | PRN

## 2022-04-22 MED ORDER — WITCH HAZEL-GLYCERIN EX PADS: 1.0000 | MEDICATED_PAD | CUTANEOUS | Status: DC | PRN

## 2022-04-22 MED ORDER — SENNOSIDES-DOCUSATE SODIUM 8.6-50 MG PO TABS
2.0000 | ORAL_TABLET | ORAL | Status: DC
  Administered 2022-04-22: 2 via ORAL
  Filled 2022-04-22 (×2): qty 2

## 2022-04-22 MED ORDER — LACTATED RINGERS IV SOLN: INTRAVENOUS | Status: DC

## 2022-04-22 MED ORDER — MORPHINE SULFATE (PF) 0.5 MG/ML IJ SOLN
INTRAMUSCULAR | Status: AC
  Filled 2022-04-22: qty 10

## 2022-04-22 MED ORDER — SOD CITRATE-CITRIC ACID 500-334 MG/5ML PO SOLN
ORAL | Status: AC
  Filled 2022-04-22: qty 15

## 2022-04-22 MED ORDER — MORPHINE SULFATE (PF) 0.5 MG/ML IJ SOLN: INTRAMUSCULAR | Status: DC | PRN

## 2022-04-22 MED ORDER — NALOXONE HCL 0.4 MG/ML IJ SOLN: 0.4000 mg | INTRAMUSCULAR | Status: DC | PRN

## 2022-04-22 MED ORDER — OXYCODONE HCL 5 MG PO TABS: 5.0000 mg | ORAL_TABLET | ORAL | Status: AC | PRN

## 2022-04-22 MED ORDER — SODIUM CHLORIDE 0.9% FLUSH: 3.0000 mL | INTRAVENOUS | Status: DC | PRN

## 2022-04-22 MED ORDER — DIPHENHYDRAMINE HCL 50 MG/ML IJ SOLN: 12.5000 mg | INTRAMUSCULAR | Status: DC | PRN

## 2022-04-22 MED ORDER — OXYTOCIN-SODIUM CHLORIDE 30-0.9 UT/500ML-% IV SOLN
INTRAVENOUS | Status: DC | PRN
  Administered 2022-04-22: 300 mL via INTRAVENOUS

## 2022-04-22 MED ORDER — OXYTOCIN-SODIUM CHLORIDE 30-0.9 UT/500ML-% IV SOLN
INTRAVENOUS | Status: AC
  Filled 2022-04-22: qty 500

## 2022-04-22 MED ORDER — FAMOTIDINE 20 MG PO TABS
20.0000 mg | ORAL_TABLET | Freq: Once | ORAL | Status: AC
  Administered 2022-04-22: 20 mg via ORAL
  Filled 2022-04-22: qty 1

## 2022-04-22 MED ORDER — MEPERIDINE HCL 25 MG/ML IJ SOLN: 6.2500 mg | INTRAMUSCULAR | Status: DC | PRN

## 2022-04-22 MED ORDER — BUPIVACAINE HCL (PF) 0.5 % IJ SOLN
5.0000 mL | Freq: Once | INTRAMUSCULAR | Status: DC
  Filled 2022-04-22: qty 10

## 2022-04-22 MED ORDER — MENTHOL 3 MG MT LOZG: 1.0000 | LOZENGE | OROMUCOSAL | Status: DC | PRN

## 2022-04-22 MED ORDER — SCOPOLAMINE 1 MG/3DAYS TD PT72: 1.0000 | MEDICATED_PATCH | Freq: Once | TRANSDERMAL | Status: DC

## 2022-04-22 MED ORDER — KETOROLAC TROMETHAMINE 30 MG/ML IJ SOLN: 30.0000 mg | Freq: Four times a day (QID) | INTRAMUSCULAR | Status: AC

## 2022-04-22 MED ORDER — DIPHENHYDRAMINE HCL 25 MG PO CAPS: 25.0000 mg | ORAL_CAPSULE | Freq: Four times a day (QID) | ORAL | Status: DC | PRN

## 2022-04-22 MED ORDER — BUPIVACAINE 0.25 % ON-Q PUMP DUAL CATH 400 ML
400.0000 mL | INJECTION | Status: DC
  Filled 2022-04-22: qty 400

## 2022-04-22 MED ORDER — OXYCODONE HCL 5 MG PO TABS
5.0000 mg | ORAL_TABLET | Freq: Four times a day (QID) | ORAL | Status: DC | PRN
  Administered 2022-04-23 – 2022-04-24 (×3): 5 mg via ORAL
  Filled 2022-04-22 (×3): qty 1

## 2022-04-22 MED ORDER — OXYCODONE-ACETAMINOPHEN 5-325 MG PO TABS: 1.0000 | ORAL_TABLET | ORAL | Status: DC | PRN

## 2022-04-22 MED ORDER — KETOROLAC TROMETHAMINE 30 MG/ML IJ SOLN
30.0000 mg | Freq: Four times a day (QID) | INTRAMUSCULAR | Status: AC
  Administered 2022-04-22 – 2022-04-23 (×3): 30 mg via INTRAVENOUS
  Filled 2022-04-22 (×4): qty 1

## 2022-04-22 MED ORDER — CHLORHEXIDINE GLUCONATE 0.12 % MT SOLN
15.0000 mL | Freq: Once | OROMUCOSAL | Status: AC
  Administered 2022-04-22: 15 mL via OROMUCOSAL
  Filled 2022-04-22: qty 15

## 2022-04-22 MED ORDER — DIPHENHYDRAMINE HCL 25 MG PO CAPS: 25.0000 mg | ORAL_CAPSULE | ORAL | Status: DC | PRN

## 2022-04-22 MED ORDER — BUPIVACAINE HCL (PF) 0.5 % IJ SOLN
INTRAMUSCULAR | Status: DC | PRN
  Administered 2022-04-22: 10 mL

## 2022-04-22 MED ORDER — PHENYLEPHRINE HCL (PRESSORS) 10 MG/ML IV SOLN
INTRAVENOUS | Status: AC
  Filled 2022-04-22: qty 1

## 2022-04-22 MED ORDER — FENTANYL CITRATE (PF) 100 MCG/2ML IJ SOLN
INTRAMUSCULAR | Status: DC | PRN
  Administered 2022-04-22: 15 ug via INTRATHECAL

## 2022-04-22 MED ORDER — KETOROLAC TROMETHAMINE 30 MG/ML IJ SOLN
30.0000 mg | Freq: Four times a day (QID) | INTRAMUSCULAR | Status: AC | PRN
  Administered 2022-04-22: 30 mg via INTRAVENOUS

## 2022-04-22 MED ORDER — SOD CITRATE-CITRIC ACID 500-334 MG/5ML PO SOLN
30.0000 mL | ORAL | Status: AC
  Administered 2022-04-22: 30 mL via ORAL

## 2022-04-22 MED ORDER — OXYTOCIN-SODIUM CHLORIDE 30-0.9 UT/500ML-% IV SOLN
2.5000 [IU]/h | INTRAVENOUS | Status: AC
  Filled 2022-04-22: qty 500

## 2022-04-22 MED ORDER — MORPHINE SULFATE (PF) 0.5 MG/ML IJ SOLN
INTRAMUSCULAR | Status: DC | PRN
  Administered 2022-04-22: .1 mg via INTRATHECAL

## 2022-04-22 MED ORDER — FENTANYL CITRATE (PF) 100 MCG/2ML IJ SOLN
INTRAMUSCULAR | Status: AC
  Filled 2022-04-22: qty 2

## 2022-04-22 MED ORDER — DIBUCAINE (PERIANAL) 1 % EX OINT: 1.0000 | TOPICAL_OINTMENT | CUTANEOUS | Status: DC | PRN

## 2022-04-22 MED ORDER — ONDANSETRON HCL 4 MG/2ML IJ SOLN
INTRAMUSCULAR | Status: DC | PRN
  Administered 2022-04-22: 4 mg via INTRAVENOUS

## 2022-04-22 MED ORDER — FERROUS SULFATE 325 (65 FE) MG PO TABS
325.0000 mg | ORAL_TABLET | Freq: Two times a day (BID) | ORAL | Status: DC
  Administered 2022-04-22 – 2022-04-24 (×4): 325 mg via ORAL
  Filled 2022-04-22 (×4): qty 1

## 2022-04-22 MED ORDER — DEXAMETHASONE SODIUM PHOSPHATE 10 MG/ML IJ SOLN
INTRAMUSCULAR | Status: DC | PRN
  Administered 2022-04-22: 10 mg via INTRAVENOUS

## 2022-04-22 MED ORDER — DEXAMETHASONE SODIUM PHOSPHATE 10 MG/ML IJ SOLN
INTRAMUSCULAR | Status: AC
  Filled 2022-04-22: qty 1

## 2022-04-22 MED ORDER — PHENYLEPHRINE HCL-NACL 20-0.9 MG/250ML-% IV SOLN
INTRAVENOUS | Status: DC | PRN
  Administered 2022-04-22: 30 ug/min via INTRAVENOUS

## 2022-04-22 MED ORDER — IBUPROFEN 600 MG PO TABS
600.0000 mg | ORAL_TABLET | Freq: Four times a day (QID) | ORAL | Status: DC
  Administered 2022-04-23 – 2022-04-24 (×5): 600 mg via ORAL
  Filled 2022-04-22 (×5): qty 1

## 2022-04-22 MED ORDER — BUPIVACAINE HCL (PF) 0.5 % IJ SOLN: 5.0000 mL | Freq: Once | INTRAMUSCULAR | Status: DC

## 2022-04-22 MED ORDER — ORAL CARE MOUTH RINSE: 15.0000 mL | Freq: Once | OROMUCOSAL | Status: AC

## 2022-04-22 MED ORDER — KETOROLAC TROMETHAMINE 30 MG/ML IJ SOLN: 30.0000 mg | Freq: Four times a day (QID) | INTRAMUSCULAR | Status: AC | PRN

## 2022-04-22 MED ORDER — COCONUT OIL OIL
1.0000 | TOPICAL_OIL | Status: DC | PRN
  Filled 2022-04-22: qty 7.5

## 2022-04-22 MED ORDER — PRENATAL MULTIVITAMIN CH
1.0000 | ORAL_TABLET | Freq: Every day | ORAL | Status: DC
  Administered 2022-04-22 – 2022-04-23 (×2): 1 via ORAL
  Filled 2022-04-22 (×2): qty 1

## 2022-04-22 MED ORDER — ONDANSETRON HCL 4 MG/2ML IJ SOLN: 4.0000 mg | Freq: Three times a day (TID) | INTRAMUSCULAR | Status: DC | PRN

## 2022-04-22 MED ORDER — CEFAZOLIN SODIUM-DEXTROSE 2-4 GM/100ML-% IV SOLN
2.0000 g | INTRAVENOUS | Status: AC
  Administered 2022-04-22: 2 g via INTRAVENOUS
  Filled 2022-04-22: qty 100

## 2022-04-22 MED ORDER — SIMETHICONE 80 MG PO CHEW
80.0000 mg | CHEWABLE_TABLET | Freq: Three times a day (TID) | ORAL | Status: DC
  Administered 2022-04-22 – 2022-04-24 (×6): 80 mg via ORAL
  Filled 2022-04-22 (×6): qty 1

## 2022-04-22 MED ORDER — ONDANSETRON HCL 4 MG/2ML IJ SOLN
INTRAMUSCULAR | Status: AC
  Filled 2022-04-22: qty 2

## 2022-04-22 MED ORDER — OXYCODONE-ACETAMINOPHEN 5-325 MG PO TABS: 2.0000 | ORAL_TABLET | ORAL | Status: DC | PRN

## 2022-04-22 MED ORDER — ACETAMINOPHEN 325 MG PO TABS
650.0000 mg | ORAL_TABLET | Freq: Four times a day (QID) | ORAL | Status: AC
  Administered 2022-04-22 – 2022-04-23 (×4): 650 mg via ORAL
  Filled 2022-04-22 (×4): qty 2

## 2022-04-22 MED ORDER — BUPIVACAINE IN DEXTROSE 0.75-8.25 % IT SOLN
INTRATHECAL | Status: DC | PRN
  Administered 2022-04-22: 1.6 mL via INTRATHECAL

## 2022-04-22 SURGICAL SUPPLY — 32 items
ADH SKN CLS APL DERMABOND .7 (GAUZE/BANDAGES/DRESSINGS) ×1
CATH KIT ON-Q SILVERSOAK 5 (CATHETERS) ×2 IMPLANT
CATH KIT ON-Q SILVERSOAK 5IN (CATHETERS) ×2 IMPLANT
DERMABOND ADVANCED .7 DNX12 (GAUZE/BANDAGES/DRESSINGS) ×1 IMPLANT
DRSG OPSITE POSTOP 4X10 (GAUZE/BANDAGES/DRESSINGS) ×1 IMPLANT
DRSG TELFA 3X8 NADH STRL (GAUZE/BANDAGES/DRESSINGS) ×1 IMPLANT
ELECT CAUTERY BLADE 6.4 (BLADE) ×1 IMPLANT
ELECT REM PT RETURN 9FT ADLT (ELECTROSURGICAL) ×1
ELECTRODE REM PT RTRN 9FT ADLT (ELECTROSURGICAL) ×1 IMPLANT
GAUZE SPONGE 4X4 12PLY STRL (GAUZE/BANDAGES/DRESSINGS) ×1 IMPLANT
GLOVE BIO SURGEON STRL SZ7 (GLOVE) ×1 IMPLANT
GLOVE INDICATOR 7.5 STRL GRN (GLOVE) ×1 IMPLANT
GOWN STRL REUS W/ TWL LRG LVL3 (GOWN DISPOSABLE) ×3 IMPLANT
GOWN STRL REUS W/TWL LRG LVL3 (GOWN DISPOSABLE) ×3
MANIFOLD NEPTUNE II (INSTRUMENTS) ×1 IMPLANT
MAT PREVALON FULL STRYKER (MISCELLANEOUS) ×1 IMPLANT
NS IRRIG 1000ML POUR BTL (IV SOLUTION) ×1 IMPLANT
PACK C SECTION AR (MISCELLANEOUS) ×1 IMPLANT
PAD OB MATERNITY 4.3X12.25 (PERSONAL CARE ITEMS) ×2 IMPLANT
PAD PREP 24X41 OB/GYN DISP (PERSONAL CARE ITEMS) ×1 IMPLANT
SCRUB CHG 4% DYNA-HEX 4OZ (MISCELLANEOUS) ×1 IMPLANT
STRIP CLOSURE SKIN 1/2X4 (GAUZE/BANDAGES/DRESSINGS) ×1 IMPLANT
SUT MNCRL 4-0 (SUTURE) ×1
SUT MNCRL 4-0 27XMFL (SUTURE) ×1
SUT PDS AB 1 TP1 96 (SUTURE) ×1 IMPLANT
SUT PLAIN GUT 0 (SUTURE) IMPLANT
SUT VIC AB 0 CTX 36 (SUTURE) ×2
SUT VIC AB 0 CTX36XBRD ANBCTRL (SUTURE) ×2 IMPLANT
SUTURE MNCRL 4-0 27XMF (SUTURE) ×1 IMPLANT
SWABSTK COMLB BENZOIN TINCTURE (MISCELLANEOUS) ×1 IMPLANT
TRAP FLUID SMOKE EVACUATOR (MISCELLANEOUS) ×1 IMPLANT
WATER STERILE IRR 500ML POUR (IV SOLUTION) ×1 IMPLANT

## 2022-04-22 NOTE — Transfer of Care (Signed)
  Anesthesia Post-op Note  Patient: Sharon Houston  Procedure(s) Performed: Procedure(s): CESAREAN SECTION (N/A)  Patient Location: L & D 6   Anesthesia Type:Spinal  Level of Consciousness: awake, alert  and oriented  Airway and Oxygen Therapy: Patient Spontanous Breathing  Post-op Pain: none  Post-op Assessment: Post-op Vital signs reviewed  Post-op Vital Signs: Reviewed and stable  Last Vitals:  Vitals:   04/22/22 0606 04/22/22 0918  BP: 123/88 119/80  Pulse: 96   Resp: 16 12  Temp:    SpO2:  98%    Complications: No apparent anesthesia complications

## 2022-04-22 NOTE — Op Note (Signed)
Cesarean Section Operative Note    Patient Name: Sharon Houston  MRN: [id number]  Date of Surgery: 04/22/2022   Pre-operative Diagnosis:  1) Malpresentation of fetus, antepartum 2) intrauterine pregnancy at [redacted]w[redacted]d   Post-operative Diagnosis:  1) Malpresentation of fetus, antepartum 2) intrauterine pregnancy at [redacted]w[redacted]d    Procedure: Primary Low Transverse Cesarean Delivery via Pfannenstiel incision with double layer uterine closure  Surgeon: Surgeon(s) and Role:    Conard Novak, MD - Primary   Assistants: Chari Manning, CNM; No other capable assistant available, in surgery requiring high level assistant.  Anesthesia: spinal   Findings:  1) normal appearing gravid uterus, fallopian tubes, and ovaries 2) viable female infant with weight of 3,360 grams (7 lbs 7 oz), APGARs 8 and 9   Quantified Blood Loss: 570 mL  Total IV Fluids: 700 ml   Urine Output:  50 mL  Specimens: none  Complications: no complications  Disposition: PACU - hemodynamically stable.   Maternal Condition: stable   Baby condition / location:  Couplet care / Skin to Skin  Procedure Details:  The patient was seen in the Holding Room. The risks, benefits, complications, treatment options, and expected outcomes were discussed with the patient. The patient concurred with the proposed plan, giving informed consent. identified as Jullianna Wetter and the procedure verified as C-Section Delivery. A Time Out was held and the above information confirmed.   After induction of anesthesia, the patient was draped and prepped in the usual sterile manner. A Pfannenstiel incision was made and carried down through the subcutaneous tissue to the fascia. Fascial incision was made and extended transversely. The fascia was separated from the underlying rectus tissue superiorly and inferiorly. The peritoneum was identified and entered. Peritoneal incision was extended longitudinally. The bladder flap was not bluntly or  sharply freed from the lower uterine segment. A low transverse uterine incision was made and the hysterotomy was extended with cranial-caudal tension. Delivered from breech (frank) presentation was a 3,360 gram Living newborn infant(s) or Female with Apgar scores of 8 at one minute and 9 at five minutes. Cord ph was not sent the umbilical cord was clamped and cut cord blood was obtained for evaluation. The placenta was removed Intact and appeared normal. The uterine outline, tubes and ovaries appeared normal. The uterine incision was closed with running locked sutures of 0 Vicryl.  A second layer of the same suture was thrown in an imbricating fashion.  Hemostasis was assured.  The uterus was returned to the abdomen and the paracolic gutters were cleared of all clots and debris.  The rectus muscles were inspected and found to be hemostatic.  The On-Q catheters were inserted in accordance with the manufacturer's recommendations.  The catheters were inserted approximately 4cm cephelad to the incision line, approximately 1cm apart, straddling the midline.  They were inserted to a depth of the 4th mark. They were positioned superficial to the rectus abdominus muscles and deep to the rectus fascia.    The fascia was then reapproximated with running sutures of 1-0 PDS, looped. The subcutaneous tissue was reapproximated using 3-0 Vicryl such that no greater than 2cm of dead space remained. The subcuticular closure was performed using 4-0 monocryl. The skin closure was reinforced using surgical skin glue.  The On-Q catheters were bolused with 5 mL of 0.5% marcaine plain for a total of 10 mL.  The catheters were affixed to the skin with surgical skin glue, steri-strips, and tegaderm.    The surgical assistant performed tissue  retraction, assistance with suturing, and fundal pressure.  Instrument, sponge, and needle counts were correct prior the abdominal closure and were correct at the conclusion of the case.  The  patient received Ancef 2 gram IV prior to skin incision (within 30 minutes). For VTE prophylaxis she was wearing SCDs throughout the case.  The assistant surgeon was a CNM due to lack of availability of another Sales promotion account executive.    Signed: Conard Novak, MD 04/22/2022 9:06 AM

## 2022-04-22 NOTE — Lactation Note (Addendum)
This note was copied from a baby's chart. Lactation Consultation Note  Patient Name: Sharon Houston [id number] Date: 04/22/2022 Age:29 hours Reason for consult: Initial assessment;Term   Maternal Data This is mom's 2nd baby, C/S for malpresentation. Mom with some breastfeeding experience.  On initial visit mom reports baby is latching and breastfeeding well since birth. Mom who is returning to work in 8 weeks had questions about when to start pumping to accumulate milk .  Has patient been taught Hand Expression?: Yes Does the patient have breastfeeding experience prior to this delivery?: Yes How long did the patient breastfeed?: 3 months Mom reports 1st baby had latching issues, she needed to use formula early, and she had low milk supply.  Feeding Mother's Current Feeding Choice: Breast Milk   Interventions Interventions: Breast feeding basics reviewed;Education Reviewed with mom tips and strategies when pumping to prepare for her return to work including when to initiate pumping.  Discharge Pump: Personal  Consult Status Consult Status: Follow-up Date: 04/23/22 Follow-up type: In-patient  Update provided to care nurse.  Sharon Houston 04/22/2022, 5:24 PM

## 2022-04-22 NOTE — Consult Note (Signed)
Neonatology Note:   Attendance at C-section:    I was asked by Dr. Jean Rosenthal to attend this repeat C/S delivery at term due to [facility]. The mother is a 26yo who is GBS neg with good prenatal care.  ROM 0h 33m prior to delivery, fluid clear. Infant vigorous with good spontaneous cry and tone. +60 sec DCC done.  Needed minimal bulb suctioning. Lungs clear to ausc, good tone and pink in DR.  Apgars 8 at 1 minute, 9 at 5 minutes.  Family updated.  To MBU in care of Pediatrician.  [name], MD Neonatologist 04/22/2022, 9:01 AM

## 2022-04-22 NOTE — Anesthesia Preprocedure Evaluation (Addendum)
Anesthesia Evaluation  Patient identified by MRN, date of birth, ID band Patient awake    Reviewed: Allergy & Precautions, NPO status , Patient's Chart, lab work & pertinent test results  History of Anesthesia Complications Negative for: history of anesthetic complications  Airway Mallampati: III  TM Distance: >3 FB Neck ROM: full    Dental no notable dental hx.    Pulmonary neg pulmonary ROS   Pulmonary exam normal        Cardiovascular Exercise Tolerance: Good negative cardio ROS Normal cardiovascular exam     Neuro/Psych    GI/Hepatic negative GI ROS,,,  Endo/Other    Renal/GU   negative genitourinary   Musculoskeletal   Abdominal   Peds  Hematology negative hematology ROS (+)   Anesthesia Other Findings Past Medical History: No date: History of fracture of leg     Comment:  as child No date: History of multiple miscarriages  Past Surgical History: 12/11/2020: BIOPSY; Bilateral     Comment:  Procedure: BIOPSY of peritoneum;  Surgeon: Christeen Douglas, MD;  Location: [facility] ORS;  Service: Gynecology;                Laterality: Bilateral; 04/01/2020: DILATION AND EVACUATION; N/A     Comment:  Procedure: DILATATION AND EVACUATION;  Surgeon: Christeen Douglas, MD;  Location: [facility] ORS;  Service: Gynecology;                Laterality: N/A; 1996: FRACTURE SURGERY     Comment:  leg was wrapped in blanket as a child caused hairline               fracture in leg 12/11/2020: HYSTEROSCOPY WITH D & C; N/A     Comment:  Procedure: DILATATION AND CURETTAGE /HYSTEROSCOPY;                Surgeon: Christeen Douglas, MD;  Location: [facility] ORS;                Service: Gynecology;  Laterality: N/A; 12/11/2020: LAPAROSCOPY; N/A     Comment:  Procedure: LAPAROSCOPY DIAGNOSTIC,  EXCISION OF UTERINE               SEPTUM;  Surgeon: Christeen Douglas, MD;  Location: [facility]               ORS;  Service:  Gynecology;  Laterality: N/A;     Reproductive/Obstetrics (+) Pregnancy                             Anesthesia Physical Anesthesia Plan  ASA: 2  Anesthesia Plan: Spinal   Post-op Pain Management:    Induction:   PONV Risk Score and Plan:   Airway Management Planned: Natural Airway and Nasal Cannula  Additional Equipment:   Intra-op Plan:   Post-operative Plan:   Informed Consent: I have reviewed the patients History and Physical, chart, labs and discussed the procedure including the risks, benefits and alternatives for the proposed anesthesia with the patient or authorized representative who has indicated his/her understanding and acceptance.     Dental Advisory Given  Plan Discussed with: Anesthesiologist  Anesthesia Plan Comments: (Patient reports no bleeding problems and no anticoagulant use.  Plan for spinal with backup GA  Patient consented for risks of anesthesia including but not limited  to:  - adverse reactions to medications - damage to eyes, teeth, lips or other oral mucosa - nerve damage due to positioning  - risk of bleeding, infection and or nerve damage from spinal that could lead to paralysis - risk of headache or failed spinal - damage to teeth, lips or other oral mucosa - sore throat or hoarseness - damage to heart, brain, nerves, lungs, other parts of body or loss of life  Patient voiced understanding.)       Anesthesia Quick Evaluation

## 2022-04-22 NOTE — Interval H&P Note (Signed)
History and Physical Interval Note:  04/22/2022 7:27 AM  Sharon Houston  has presented today for surgery, with the diagnosis of Breech.  The various methods of treatment have been discussed with the patient and family. After consideration of risks, benefits and other options for treatment, the patient has consented to  Procedure(s): CESAREAN SECTION (N/A) as a surgical intervention.  The patient's history has been reviewed, patient examined, no change in status, stable for surgery.  I have reviewed the patient's chart and labs.  Questions were answered to the patient's satisfaction.    Bedside ultrasound performed and fetus still in breech presentation. Will proceed with surgical delivery.   Thomasene Mohair, MD, [facility] 04/22/2022 7:27 AM

## 2022-04-22 NOTE — Discharge Summary (Signed)
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  Hx HPV Pos; Pap neg Elevated A1C 5.7; early 1hr GTT:109 Low-lying posterior placenta 0.8cm from os at anatomy US Anemia Breech at 30 weeks [place]  Discharge diagnosis: Term Pregnancy Delivered                                              Post partum procedures: none Augmentation: N/A Complications: None  Hospital course: Sceduled C/S   27 y.o. yo G2P0 at [redacted]w[redacted]d was admitted to the hospital 04/22/2022 for scheduled cesarean section with the following indication:Malpresentation.Delivery details are as follows:  Membrane Rupture Time/Date: 8:20 AM ,04/22/2022   Delivery Method:C-Section, Low Transverse  Details of operation can be found in separate operative note.  Patient had a postpartum course with no complications.  She is ambulating, tolerating a regular diet, passing flatus, and urinating well. Patient is discharged home in stable condition on  04/24/22        Newborn Data: Birth date:04/22/2022  Birth time:8:22 AM  Gender:Female  "[name]" Living status:Living  Apgars:8 ,9  Weight:3360 g   (7 lb 7 oz)  Magnesium Sulfate received: No BMZ received: No Rhophylac:N/A MMR:No T-DaP:Given prenatally Flu: No Transfusion:No  Physical exam  Vitals:   04/23/22 1530 04/23/22 2005 04/23/22 2354 04/24/22 0724  BP: 121/79 134/81 110/65 131/81  Pulse: 97 91 78 82  Resp: 20 18 20 18   Temp: 98 F  (36.7 C) 98.3 F (36.8 C) 98.4 F (36.9 C) 98.5 F (36.9 C)  TempSrc:  Oral Oral Oral  SpO2:  99% 98% 99%   General: alert, cooperative, and no distress Lochia: appropriate Uterine Fundus: firm Incision: Healing well with no significant drainage, No significant erythema, Dressing is clean, dry, and intact, OnQ pump in place DVT Evaluation: No evidence of DVT seen on physical exam. Labs: Lab Results  Component Value Date   WBC 13.5 (H) 04/23/2022   HGB 10.0 (L) 04/23/2022   HCT 30.4 (L) 04/23/2022   MCV 88.9 04/23/2022   PLT 175 04/23/2022      Latest Ref Rng & Units 12/07/2020   10:57 AM  CMP  Glucose 70 - 99 mg/dL 95   BUN 6 - 20 mg/dL 17   Creatinine 4.53 - 1.00 mg/dL 6.46   Sodium 803 - 212 mmol/L 137   Potassium 3.5 - 5.1 mmol/L 3.8   Chloride 98 - 111 mmol/L 104   CO2 22 - 32 mmol/L 28   Calcium 8.9 - 10.3 mg/dL 9.2    Edinburgh Score:    04/23/2022    3:30 PM  Edinburgh Postnatal Depression Scale Screening Tool  I have been able to laugh and see the funny side of things. 0  I have looked forward with enjoyment to things. 0  I have blamed myself unnecessarily when things went wrong. 0  I have been anxious or worried for  no good reason. 0  I have felt scared or panicky for no good reason. 0  Things have been getting on top of me. 0  I have been so unhappy that I have had difficulty sleeping. 0  I have felt sad or miserable. 0  I have been so unhappy that I have been crying. 0  The thought of harming myself has occurred to me. 0  Edinburgh Postnatal Depression Scale Total 0      After visit meds:  Allergies as of 04/24/2022   No Known Allergies      Medication List     TAKE these medications    acetaminophen 325 MG tablet Commonly known as: TYLENOL Take 2 tablets (650 mg total) by mouth every 6 (six) hours as needed for mild pain or fever.   ferrous sulfate 325 (65 FE) MG tablet Take 1 tablet (325 mg total) by mouth 2 (two) times daily with a  meal. What changed: when to take this   ibuprofen 600 MG tablet Commonly known as: ADVIL Take 1 tablet (600 mg total) by mouth every 6 (six) hours as needed for fever, headache, cramping or mild pain.   oxyCODONE 5 MG immediate release tablet Commonly known as: Oxy IR/ROXICODONE Take 1 tablet (5 mg total) by mouth every 4 (four) hours as needed for up to 7 days for severe pain.   PRE-NATAL FORMULA PO Take 1 tablet by mouth daily.   senna-docusate 8.6-50 MG tablet Commonly known as: Senokot-S Take 2 tablets by mouth at bedtime as needed for mild constipation.   simethicone 80 MG chewable tablet Commonly known as: MYLICON Chew 1 tablet (80 mg total) by mouth 4 (four) times daily as needed for flatulence.         Discharge home in stable condition Infant Feeding: Breast Infant Disposition:home with mother Discharge instruction: per After Visit Summary and Postpartum booklet. Activity: Advance as tolerated. Pelvic rest for 6 weeks.  Diet: routine diet Anticipated Birth Control: Unsure Postpartum Appointment:6 weeks Additional Postpartum F/U: Incision check 1 week Future Appointments:No future appointments. Follow up Visit:  Follow-up Information     Conard Novak, MD. Go in 1 week(s).   Specialty: Obstetrics and Gynecology Why: For incision check, (Keep previously scheduled appointment) Contact information: [address] [place] [address] [phone]                 SIGNED: Janyce Llanos, CNM 04/24/2022 8:34 AM

## 2022-04-22 NOTE — Hospital Course (Signed)
27 y.o. 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  Hx HPV Pos; Pap neg Elevated A1C 5.7; early 1hr GTT:109 Low-lying posterior placenta 0.8cm from os at anatomy US F/u at 28wks: 02/16/22: Plac=Posterior; 2.92 cm from the internal Os Anemia 28wks: Hgb 10.9 32wks: Hgb 10.6, repeat at 36wks 03/28/22 Hgb 11.2 Breech at 30 weeks [place] 02/28/22: still breech @ 32wks 02/28/22: provided with breech exercises and referred to chiropractor Screening results and needs:  NOB:  MBT: O pos Ab screen: neg  HIV: neg RPR: NR Hep B: neg Hep C: neg Rubella: immune VZV: immune TSH: 0.598 HgA1C: 5.7 Aneuploidy:  MaternitT21: Negative Second trimester (AFP/tetra):negative 11/17/21  28 weeks:  Review Medicaid Questionnaire:No change GBS: neg G/C: neg/neg Hgb: 11.2 Platelets: 211 HIV: neg RPR: NR

## 2022-04-23 LAB — CBC
HCT: 30.4 % — ABNORMAL LOW (ref 36.0–46.0)
Hemoglobin: 10 g/dL — ABNORMAL LOW (ref 12.0–15.0)
MCH: 29.2 pg (ref 26.0–34.0)
MCHC: 32.9 g/dL (ref 30.0–36.0)
MCV: 88.9 fL (ref 80.0–100.0)
Platelets: 175 10*3/uL (ref 150–400)
RBC: 3.42 MIL/uL — ABNORMAL LOW (ref 3.87–5.11)
RDW: 12.9 % (ref 11.5–15.5)
WBC: 13.5 10*3/uL — ABNORMAL HIGH (ref 4.0–10.5)
nRBC: 0 % (ref 0.0–0.2)

## 2022-04-23 MED ORDER — ACETAMINOPHEN 325 MG PO TABS
650.0000 mg | ORAL_TABLET | Freq: Four times a day (QID) | ORAL | Status: DC
  Administered 2022-04-23 – 2022-04-24 (×5): 650 mg via ORAL
  Filled 2022-04-23 (×4): qty 2

## 2022-04-23 MED ORDER — ACETAMINOPHEN 325 MG PO TABS
ORAL_TABLET | ORAL | Status: AC
  Filled 2022-04-23: qty 2

## 2022-04-23 NOTE — Anesthesia Postprocedure Evaluation (Signed)
Anesthesia Post Note  Patient: Sharon Houston  Procedure(s) Performed: CESAREAN SECTION  Patient location during evaluation: PACU Anesthesia Type: Spinal Level of consciousness: awake and alert Pain management: pain level controlled Vital Signs Assessment: post-procedure vital signs reviewed and stable Respiratory status: spontaneous breathing, nonlabored ventilation and respiratory function stable Cardiovascular status: blood pressure returned to baseline and stable Postop Assessment: no apparent nausea or vomiting Anesthetic complications: no   No notable events documented.   Last Vitals:  Vitals:   04/23/22 0500 04/23/22 0807  BP:  120/73  Pulse: 74 75  Resp:  18  Temp:  36.9 C  SpO2: 97% 99%    Last Pain:  Vitals:   04/23/22 1140  TempSrc:   PainSc: 4                  [name]

## 2022-04-23 NOTE — Progress Notes (Addendum)
Post Partum Day 1 Subjective: Doing well, no complaints.  Tolerating regular diet, pain with PO meds, voiding and ambulating without difficulty.  No CP SOB Fever,Chills, N/V or leg pain; denies nipple or breast pain, no HA change of vision, RUQ/epigastric pain  Objective: BP 120/73 (BP Location: Left Arm)   Pulse 75   Temp 98.4 F (36.9 C) (Oral)   Resp 18   LMP 07/20/2021 (Exact Date)   SpO2 99%   Breastfeeding Unknown    Physical Exam:  General: NAD Breasts: soft/nontender CV: RRR Pulm: nl effort, CTABL Abdomen: soft, NT, BS x 4 Incision: Dsg CDI/honeycomb dressing and OnQ pump present, no erythema or drainage Lochia: moderate Uterine Fundus: fundus firm and 1 fb below umbilicus DVT Evaluation: no cords, ttp LEs   Recent Labs    04/22/22 0640 04/23/22 0545  HGB 10.7* 10.0*  HCT 32.8* 30.4*  WBC 9.3 13.5*  PLT 199 175    Assessment/Plan: 26 y.o. [id number] postpartum day # 1  - Continue routine PP care - Lactation consult PRN - Discussed contraceptive options including implant, IUDs hormonal and non-hormonal, injection, pills/ring/patch, condoms, and NFP.  - Acute blood loss anemia - hemodynamically stable and asymptomatic; start po ferrous sulfate BID with stool softeners   Disposition: Does not desire Dc home today.   Janyce Llanos, CNM 04/23/2022 11:46 AM

## 2022-04-23 NOTE — Lactation Note (Signed)
This note was copied from a baby's chart. Lactation Consultation Note  Patient Name: Sharon Houston CHENI'D Date: 04/23/2022 Age:29 hours Reason for consult: Follow-up assessment;Mother's request;Term   Maternal Data Has patient been taught Hand Expression?: Yes Does the patient have breastfeeding experience prior to this delivery?: Yes  Feeding Mother's Current Feeding Choice: Breast Milk Called to room, mom sitting up in chair, concerned she is not making enough milk as she is having a hard time expressing milk on left and baby very fussy, baby had recently fed 15 min on right breast, mom shown how to hand express and was able to express drops of colostrum, also shown chart in mother baby handbook, attempted latching baby with mom in chair, baby gassy, fussy, unable to coordinate suck to start nursing, will latch and then pull off, mom assisted back to bed and positioned with pillows, baby swaddled to calm, place in modified football hold on left breast and able to latch and maintain latch, then became very sleepy and needed stimulation to suck, was able to nurse approx 10 min, then mom switched to football hold on right and was nursing on this side with stimulation when room left, mom encouraged as she is exhausted.      LATCH Score Latch: Repeated attempts needed to sustain latch, nipple held in mouth throughout feeding, stimulation needed to elicit sucking reflex.  Audible Swallowing: A few with stimulation  Type of Nipple: Everted at rest and after stimulation  Comfort (Breast/Nipple): Soft / non-tender  Hold (Positioning): Assistance needed to correctly position infant at breast and maintain latch.  LATCH Score: 7   Lactation Tools Discussed/Used    Interventions Interventions: Assisted with latch;Hand express;Adjust position;Support pillows;Position options;Coconut oil;Education  Discharge Pump: Personal WIC Program: No  Consult Status Consult Status: PRN Date:  04/23/22 Follow-up type: In-patient    [name] 04/23/2022, 3:18 PM

## 2022-04-23 NOTE — Lactation Note (Signed)
This note was copied from a baby's chart. Lactation Consultation Note  Patient Name: Sharon Houston ZOXWR'U Date: 04/23/2022 Age:29 hours Reason for consult: Follow-up assessment;Mother's request;Other (Comment) (requests to pump breasts with DEBP)   Maternal Data  Mom was exhausted and requested to feed baby formula x 2 so she could rest, now would like to pump her breasts with hospital grade DEBP, Symphony pump set up and mom instructed in use, noted colostrum expressed in bottom of shield.   Feeding Mother's Current Feeding Choice: Breast Milk and Formula  LATCH Score                    Lactation Tools Discussed/Used Tools: Pump Breast pump type: Double-Electric Breast Pump Pump Education: Setup, frequency, and cleaning;Milk Storage Reason for Pumping: mom's request, stimulation Pumping frequency: prn  Interventions Interventions: DEBP;Education  Discharge Pump: Personal;DEBP WIC Program: Yes  Consult Status Consult Status: Follow-up Date: 04/24/22 Follow-up type: In-patient    [name] 04/23/2022, 6:42 PM

## 2022-04-23 NOTE — Lactation Note (Signed)
This note was copied from a baby's chart. Lactation Consultation Note  Patient Name: Sharon Houston PNTIR'W Date: 04/23/2022 Age:29 hours Reason for consult: Follow-up assessment;Term;Breastfeeding assistance   Maternal Data Has patient been taught Hand Expression?: Yes Does the patient have breastfeeding experience prior to this delivery?: Yes How long did the patient breastfeed?: 3 mths  Feeding Mother's Current Feeding Choice: Breast Milk BAby had just fed on right breast, mom burped baby and offered left breast in cradle hold, baby eager, mom shown how to flange lips and use gentle chin pressure to widen latch as her nipples are sl tender.  Baby nursing well with occ swallow noted, mom encouraged to offer breast frequently and offer both breasts at a feeding    LATCH Score Latch: Grasps breast easily, tongue down, lips flanged, rhythmical sucking.  Audible Swallowing: A few with stimulation  Type of Nipple: Everted at rest and after stimulation  Comfort (Breast/Nipple): Filling, red/small blisters or bruises, mild/mod discomfort  Hold (Positioning): No assistance needed to correctly position infant at breast.  LATCH Score: 8   Lactation Tools Discussed/Used  [facility] name and no written on white board   Interventions Interventions: Breast feeding basics reviewed;Hand express;Support pillows;Adjust position;Coconut oil;Education Lanolin Purelan, given with instruction in use Discharge Discharge Education: Engorgement and breast care Pump: Personal WIC Program: No  Consult Status Consult Status: PRN Date: 04/23/22 Follow-up type: In-patient    [name] 04/23/2022, 10:44 AM

## 2022-04-24 MED ORDER — FERROUS SULFATE 325 (65 FE) MG PO TABS
325.0000 mg | ORAL_TABLET | Freq: Two times a day (BID) | ORAL | 3 refills | Status: DC
Start: 2022-04-24 — End: 2022-08-15

## 2022-04-24 MED ORDER — OXYCODONE HCL 5 MG PO TABS: 5.0000 mg | ORAL_TABLET | ORAL | 0 refills | Status: AC | PRN

## 2022-04-24 MED ORDER — SENNOSIDES-DOCUSATE SODIUM 8.6-50 MG PO TABS
2.0000 | ORAL_TABLET | Freq: Every evening | ORAL | 0 refills | Status: DC | PRN
Start: 2022-04-24 — End: 2022-08-15

## 2022-04-24 MED ORDER — ACETAMINOPHEN 325 MG PO TABS: 650.0000 mg | ORAL_TABLET | Freq: Four times a day (QID) | ORAL | 1 refills | Status: AC | PRN

## 2022-04-24 MED ORDER — IBUPROFEN 600 MG PO TABS
600.0000 mg | ORAL_TABLET | Freq: Four times a day (QID) | ORAL | 1 refills | Status: AC | PRN
Start: 2022-04-24 — End: ?

## 2022-04-24 MED ORDER — SIMETHICONE 80 MG PO CHEW
80.0000 mg | CHEWABLE_TABLET | Freq: Four times a day (QID) | ORAL | 0 refills | Status: DC | PRN
Start: 2022-04-24 — End: 2022-08-15

## 2022-04-24 NOTE — Progress Notes (Signed)
Verb understanding of d/c instructions/Home with family to self care

## 2022-04-25 ENCOUNTER — Encounter: Payer: Self-pay | Admitting: Obstetrics and Gynecology

## 2022-05-18 ENCOUNTER — Telehealth: Payer: Self-pay

## 2022-05-18 NOTE — Telephone Encounter (Signed)
[facility]- Discharge Call Backs-Left Voicemail about the following below. 1-Do you have any questions or concerns about yourself as you heal?No 2-Any concerns or questions about your baby?No 3- Reviewed ABC's of safe sleep. 4-How was your stay at the hospital?Good 5- Did our team work together to care for you?Yes You should be receiving a survey in the mail soon.   We would really appreciate it if you could fill that out for [place] and return it in the mail.  We value the feedback to make improvements and continue the great work we do.   If you have any questions please feel free to call me back at [phone]

## 2022-08-15 ENCOUNTER — Encounter: Payer: Self-pay | Admitting: Nurse Practitioner

## 2022-08-15 ENCOUNTER — Telehealth: Payer: Medicaid Other | Admitting: Nurse Practitioner

## 2022-08-15 VITALS — Ht 67.0 in | Wt 175.0 lb

## 2022-08-15 DIAGNOSIS — L700 Acne vulgaris: Secondary | ICD-10-CM

## 2022-08-15 MED ORDER — CLINDAMYCIN PHOS-BENZOYL PEROX 1-5 % EX GEL: Freq: Two times a day (BID) | CUTANEOUS | 1 refills | Status: DC

## 2022-08-15 NOTE — Assessment & Plan Note (Signed)
Chronic, not controlled.  She states that her acne worsened after delivering her daughter 4 months ago.  She is currently breast-feeding.  She was seeing dermatology, however they are no longer in network.  Placed referral to new dermatologist.  Will have her start BenzaClin cream twice a day.  She can continue using over-the-counter treatments as well.  Will avoid doxycycline since she is breast-feeding.  Follow-up with any concerns.

## 2022-08-15 NOTE — Patient Instructions (Signed)
It was great to see you!  Start benzaclin gel twice a day to your acne. Do not use on breasts.   I have placed a referral to dermatology.   Let's follow-up in 1-3 months, sooner if you have concerns.  If a referral was placed today, you will be contacted for an appointment. Please note that routine referrals can sometimes take up to 3-4 weeks to process. Please call our office if you haven't heard anything after this time frame.  Take care,  Rodman Pickle, NP

## 2022-08-15 NOTE — Progress Notes (Signed)
[facility] [address] [place] [address] Dept: [phone] Dept Fax: [phone]  Virtual Video Visit  I connected with Sharon Houston on 08/15/22 at  1:00 PM EDT by a video enabled telemedicine application and verified that I am speaking with the correct person using two identifiers.  Location patient: Home Location provider: Clinic Persons participating in the virtual visit: Patient; Rodman Pickle, NP; Malena Peer, CMA  I discussed the limitations of evaluation and management by telemedicine and the availability of in person appointments. The patient expressed understanding and agreed to proceed.  Chief Complaint  Patient presents with   Acne    Request dermatology referral    SUBJECTIVE:  HPI: Sharon Houston is a 27 y.o. female who presents with worsening acne.  She states that after she delivered her daughter in April, her acne had gotten worse.  She states that she was seeing a dermatologist, however they are no longer in network and she needs a referral to a new one.  She is currently breast-feeding.  She has been using topical over-the-counter treatments and Neutrogena, however they are not helping.  Patient Active Problem List   Diagnosis Date Noted   Malpresentation of fetus, antepartum 04/22/2022   Supervision of other normal pregnancy, antepartum 04/22/2022   [redacted] weeks gestation of pregnancy 04/22/2022   Acne vulgaris 08/25/2021    Past Surgical History:  Procedure Laterality Date   BIOPSY Bilateral 12/11/2020   Procedure: BIOPSY of peritoneum;  Surgeon: Christeen Douglas, MD;  Location: [facility];  Service: Gynecology;  Laterality: Bilateral;   CESAREAN SECTION N/A 04/22/2022   Procedure: CESAREAN SECTION;  Surgeon: Conard Novak, MD;  Location: [facility];  Service: Obstetrics;  Laterality: N/A;   DILATION AND EVACUATION N/A 04/01/2020   Procedure: DILATATION AND EVACUATION;  Surgeon:  Christeen Douglas, MD;  Location: [facility];  Service: Gynecology;  Laterality: N/A;   FRACTURE SURGERY  1996   leg was wrapped in blanket as a child caused hairline fracture in leg   HYSTEROSCOPY WITH D & C N/A 12/11/2020   Procedure: DILATATION AND CURETTAGE /HYSTEROSCOPY;  Surgeon: Christeen Douglas, MD;  Location: [facility];  Service: Gynecology;  Laterality: N/A;   LAPAROSCOPY N/A 12/11/2020   Procedure: LAPAROSCOPY DIAGNOSTIC,  EXCISION OF UTERINE SEPTUM;  Surgeon: Christeen Douglas, MD;  Location: [facility];  Service: Gynecology;  Laterality: N/A;    Family History  Problem Relation Age of Onset   Alcohol abuse Father    Drug abuse Father    Diabetes Maternal Grandfather    Obesity Maternal Grandfather    Arthritis Maternal Grandmother    Asthma Maternal Grandmother    Obesity Maternal Grandmother    Stroke Maternal Grandmother    Obesity Maternal Aunt     Social History   Tobacco Use   Smoking status: Never   Smokeless tobacco: Never  Vaping Use   Vaping status: Former  Substance Use Topics   Alcohol use: Yes   Drug use: No     Current Outpatient Medications:    clindamycin-benzoyl peroxide (BENZACLIN) gel, Apply topically 2 (two) times daily., Disp: 25 g, Rfl: 1   Prenatal Vit-Fe Fumarate-FA (PRENATAL MULTIVITAMIN) TABS tablet, Take 1 tablet by mouth daily at 12 noon. Post natal vitamin daily, Disp: , Rfl:    acetaminophen (TYLENOL) 325 MG tablet, Take 2 tablets (650 mg total) by mouth every 6 (six) hours as needed for mild pain or fever. (Patient not taking: Reported on 08/15/2022), Disp:  30 tablet, Rfl: 1   ibuprofen (ADVIL) 600 MG tablet, Take 1 tablet (600 mg total) by mouth every 6 (six) hours as needed for fever, headache, cramping or mild pain. (Patient not taking: Reported on 08/15/2022), Disp: 30 tablet, Rfl: 1  No Known Allergies  ROS: See pertinent positives and negatives per HPI.  OBSERVATIONS/OBJECTIVE:  VITALS per patient if applicable: Today's Vitals    08/15/22 1258  Weight: 175 lb (79.4 kg)  Height: 5\' 7"  (1.702 m)   Body mass index is 27.41 kg/m.    GENERAL: Alert and oriented. Appears well and in no acute distress.  HEENT: Atraumatic. Conjunctiva clear. No obvious abnormalities on inspection of external nose and ears.  NECK: Normal movements of the head and neck.  LUNGS: On inspection, no signs of respiratory distress. Breathing rate appears normal. No obvious gross SOB, gasping or wheezing, and no conversational dyspnea.  CV: No obvious cyanosis.  MS: Moves all visible extremities without noticeable abnormality.  PSYCH/NEURO: Pleasant and cooperative. No obvious depression or anxiety. Speech and thought processing grossly intact.  Integument: Acne to bilateral cheeks and chin  ASSESSMENT AND PLAN:  Problem List Items Addressed This Visit       Musculoskeletal and Integument   Acne vulgaris - Primary    Chronic, not controlled.  She states that her acne worsened after delivering her daughter 4 months ago.  She is currently breast-feeding.  She was seeing dermatology, however they are no longer in network.  Placed referral to new dermatologist.  Will have her start BenzaClin cream twice a day.  She can continue using over-the-counter treatments as well.  Will avoid doxycycline since she is breast-feeding.  Follow-up with any concerns.      Relevant Medications   clindamycin-benzoyl peroxide (BENZACLIN) gel   Other Relevant Orders   Ambulatory referral to Dermatology     I discussed the assessment and treatment plan with the patient. The patient was provided an opportunity to ask questions and all were answered. The patient agreed with the plan and demonstrated an understanding of the instructions.   The patient was advised to call back or seek an in-person evaluation if the symptoms worsen or if the condition fails to improve as anticipated.   Gerre Scull, NP

## 2022-08-24 ENCOUNTER — Telehealth: Payer: Medicaid Other | Admitting: Nurse Practitioner

## 2022-09-02 ENCOUNTER — Ambulatory Visit: Payer: BC Managed Care – PPO | Admitting: Nurse Practitioner

## 2022-10-28 IMAGING — CR DG CHEST 2V
1 series · 2 of 2 positions shown · non-contrast
Comparison: None.

CLINICAL DATA: COVID positive with shortness of breath.

EXAM:
CHEST - 2 VIEW

[Series 1: dg chest 2 view · 0.14mm/px · 2 of 2 slices shown]
[im 1/2]
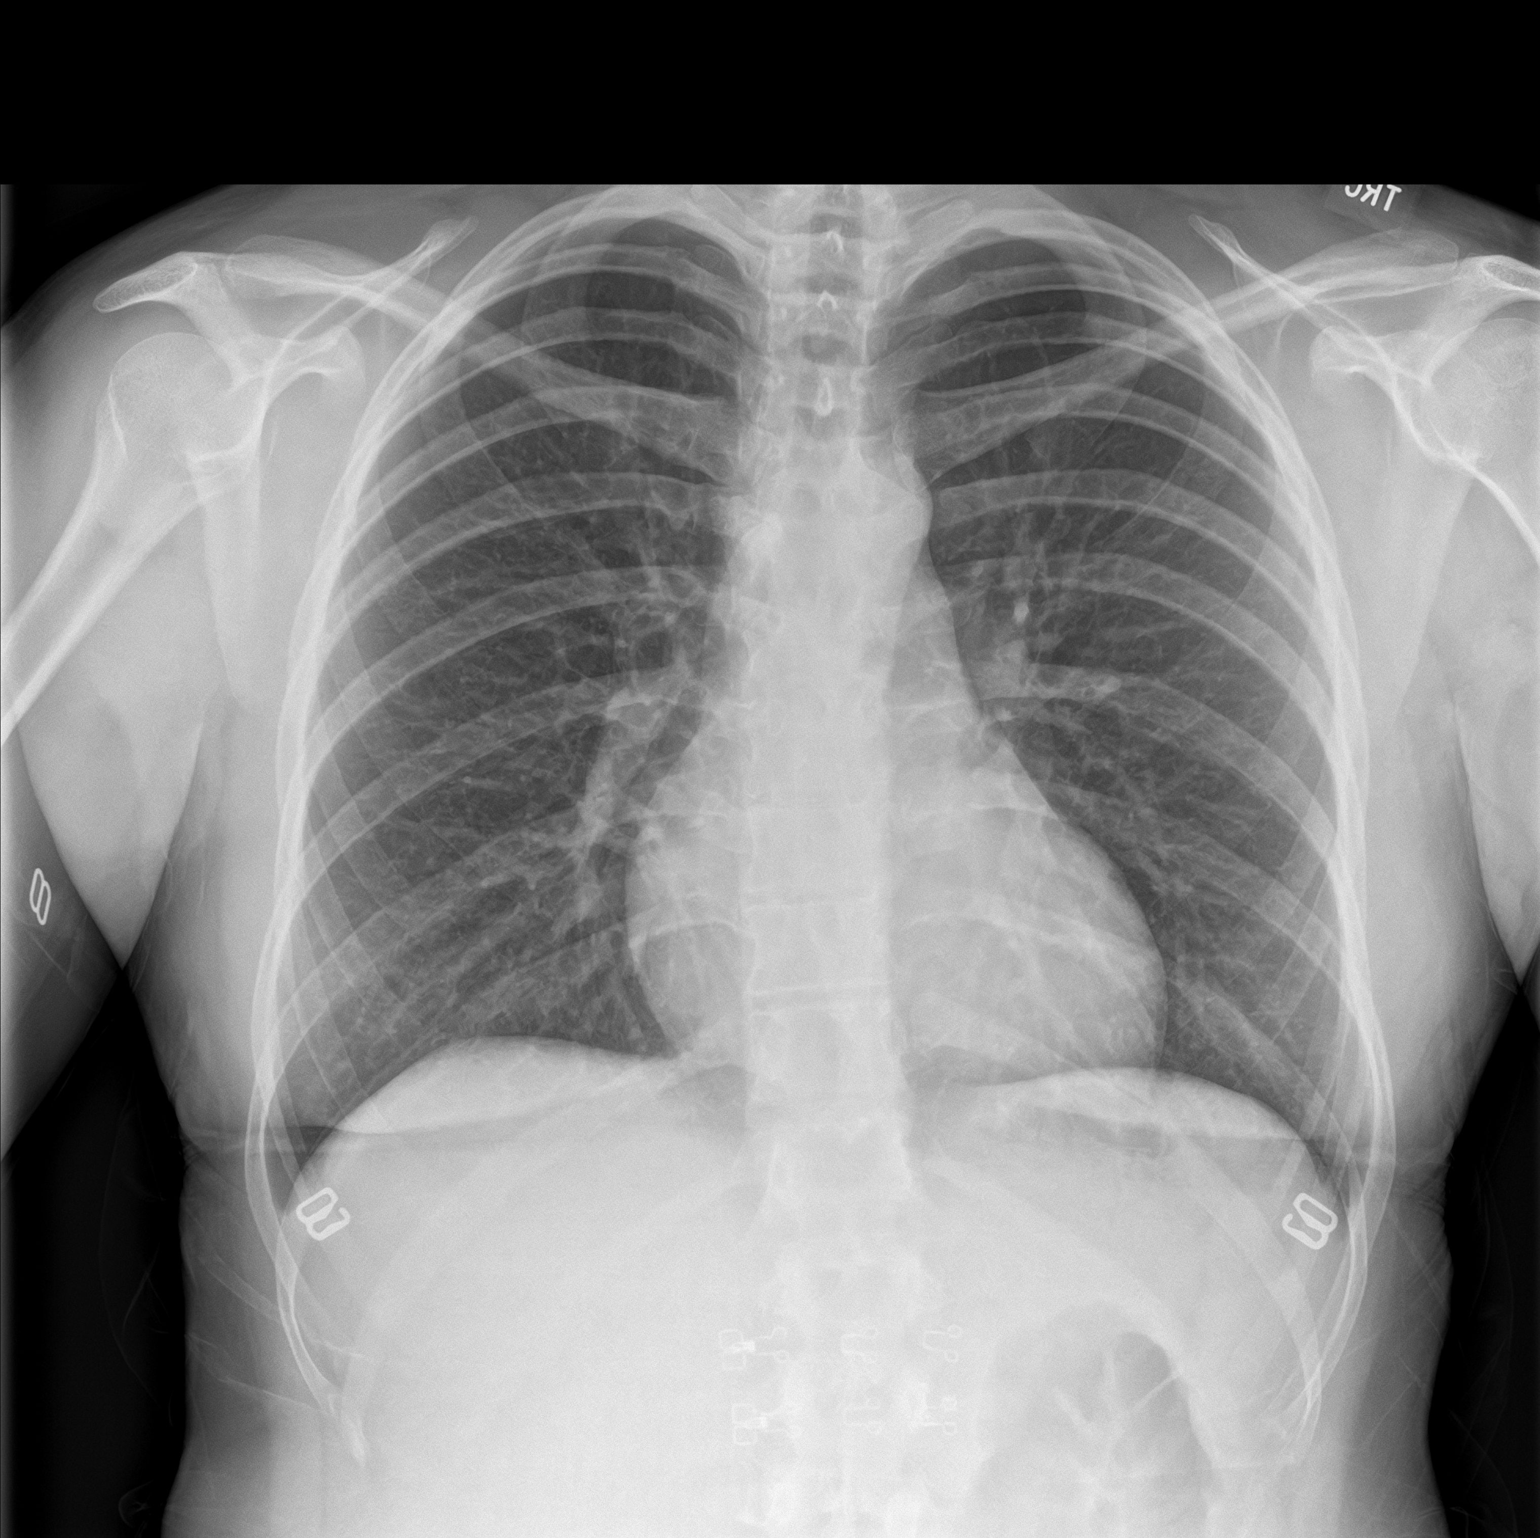
[im 2/2]
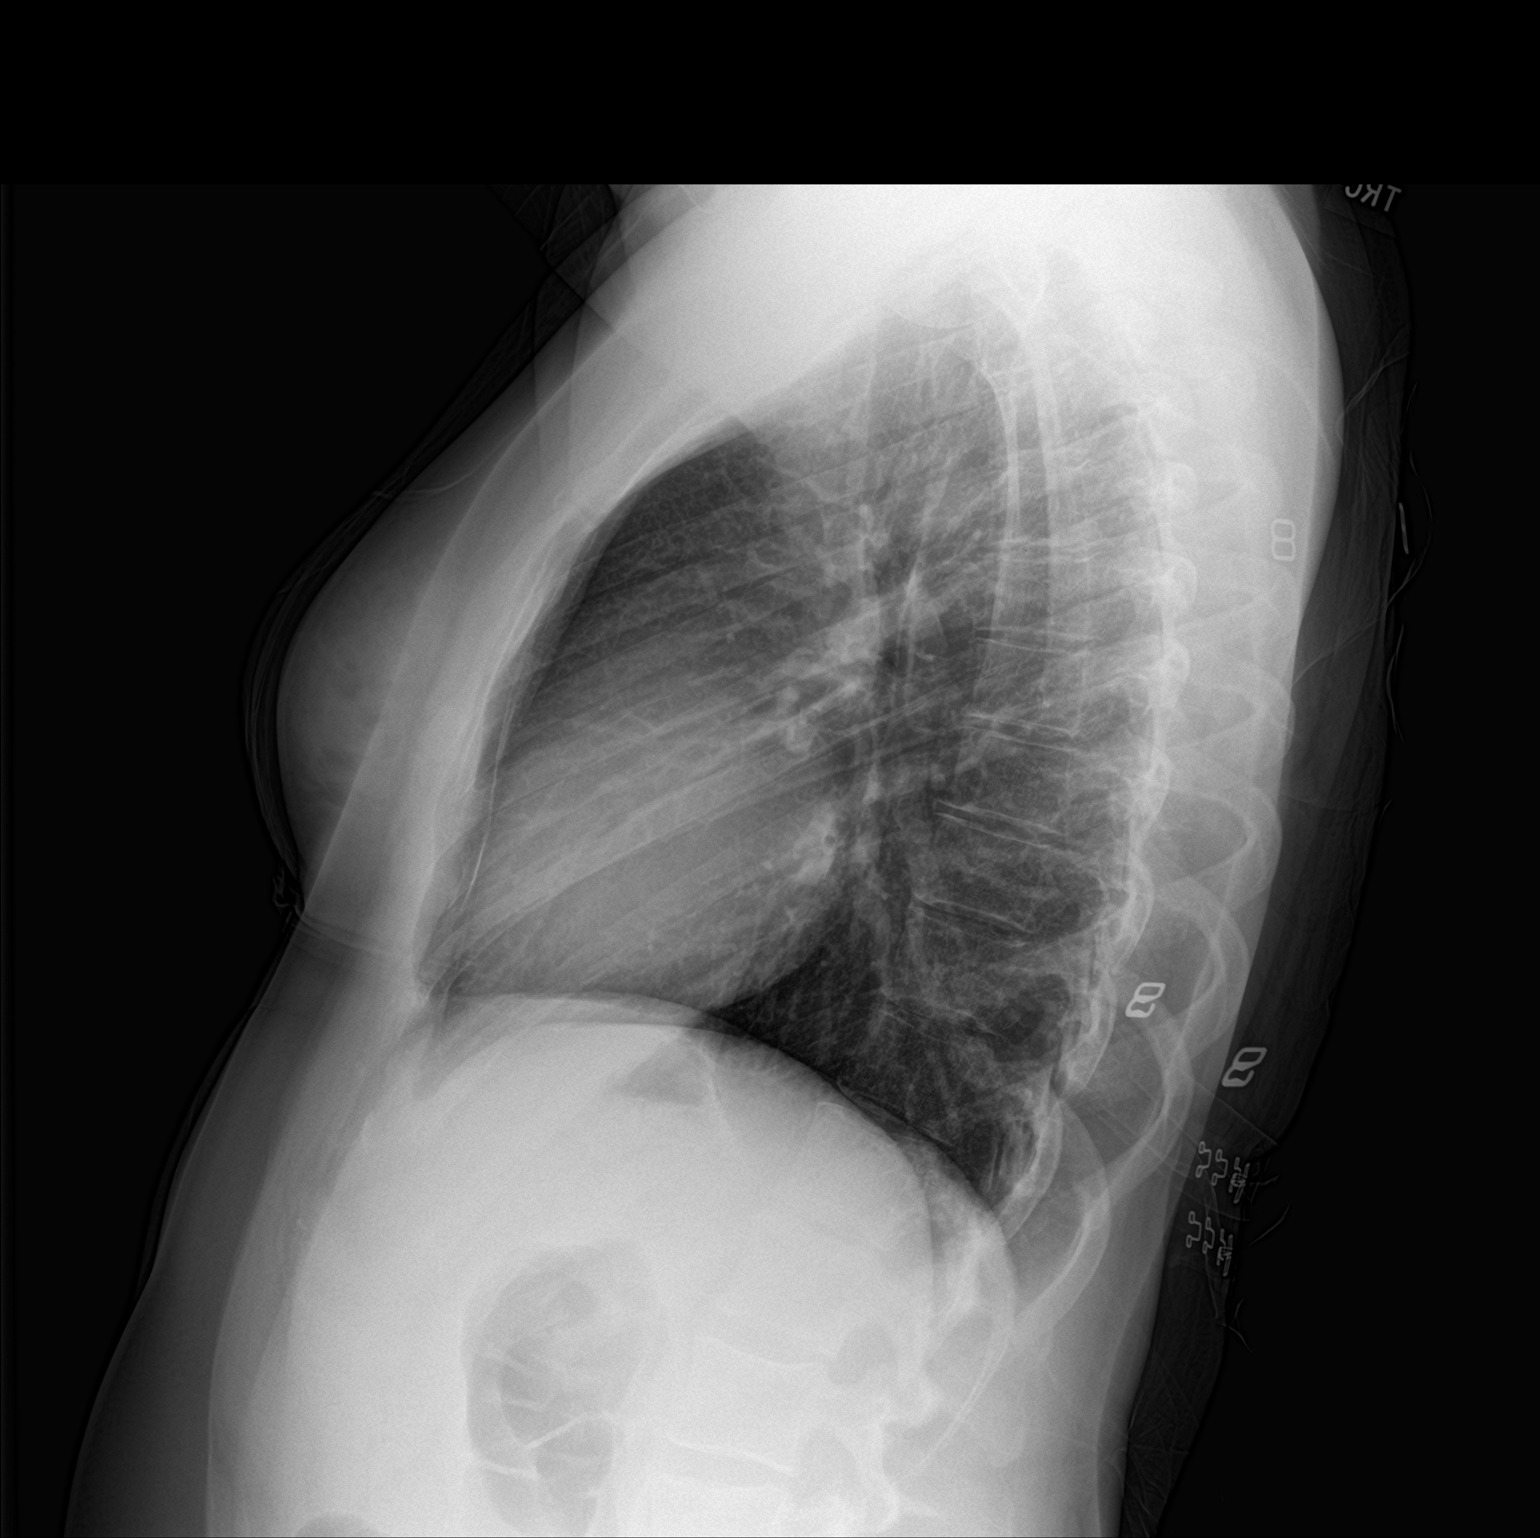

[2 of 2 positions shown; findings below may reference images not displayed]

FINDINGS: The heart size and mediastinal contours are within normal limits.
Both lungs are clear. The visualized skeletal structures are
unremarkable.
IMPRESSION: No active cardiopulmonary disease.

## 2022-11-10 ENCOUNTER — Telehealth (INDEPENDENT_AMBULATORY_CARE_PROVIDER_SITE_OTHER): Payer: Medicaid Other | Admitting: Nurse Practitioner

## 2022-11-10 ENCOUNTER — Encounter: Payer: Self-pay | Admitting: Nurse Practitioner

## 2022-11-10 DIAGNOSIS — L700 Acne vulgaris: Secondary | ICD-10-CM | POA: Diagnosis not present

## 2022-11-10 MED ORDER — DOXYCYCLINE HYCLATE 100 MG PO TABS: 100.0000 mg | ORAL_TABLET | Freq: Every day | ORAL | 0 refills | Status: DC

## 2022-11-10 MED ORDER — TRETINOIN 0.025 % EX CREA: TOPICAL_CREAM | Freq: Every day | CUTANEOUS | 0 refills | Status: DC

## 2022-11-10 NOTE — Assessment & Plan Note (Signed)
Topical benzaclin has not helped with symptoms. Will start tretinoin cream 0.025% at bedtime and doxycyline 100mg  daily since she has stopped breast feeding. Phone number given to dermatology to call and schedule an appointment. Follow-up with any concerns.

## 2022-11-10 NOTE — Progress Notes (Signed)
[facility] [address] [place] [address] Dept: [phone] Dept Fax: [phone]  Virtual Video Visit  I connected with Sharon Houston on 11/10/22 at  4:20 PM EDT by a video enabled telemedicine application and verified that I am speaking with the correct person using two identifiers.  Location patient: Home Location provider: Clinic Persons participating in the virtual visit: Patient; Rodman Pickle, NP; Malena Peer, CMA  I discussed the limitations of evaluation and management by telemedicine and the availability of in person appointments. The patient expressed understanding and agreed to proceed.  Chief Complaint  Patient presents with   video visit     PT is requesting stronger acne medication.     SUBJECTIVE:  HPI: Sharon Houston is a 27 y.o. female who presents with ongoing acne.  She states that she has been using the benzaclin gel and over the counter creams for acne, but it has not been helping. She has finished breast feeding and is interested in something stronger to help. She would also like to try a different topical treatment. She has not heard from dermatology yet to schedule an appointment.   Patient Active Problem List   Diagnosis Date Noted   Malpresentation of fetus, antepartum 04/22/2022   Supervision of other normal pregnancy, antepartum 04/22/2022   [redacted] weeks gestation of pregnancy 04/22/2022   Acne vulgaris 08/25/2021    Past Surgical History:  Procedure Laterality Date   BIOPSY Bilateral 12/11/2020   Procedure: BIOPSY of peritoneum;  Surgeon: Christeen Douglas, MD;  Location: [facility];  Service: Gynecology;  Laterality: Bilateral;   CESAREAN SECTION N/A 04/22/2022   Procedure: CESAREAN SECTION;  Surgeon: Conard Novak, MD;  Location: [facility];  Service: Obstetrics;  Laterality: N/A;   DILATION AND EVACUATION N/A 04/01/2020   Procedure: DILATATION AND EVACUATION;  Surgeon:  Christeen Douglas, MD;  Location: [facility];  Service: Gynecology;  Laterality: N/A;   FRACTURE SURGERY  1996   leg was wrapped in blanket as a child caused hairline fracture in leg   HYSTEROSCOPY WITH D & C N/A 12/11/2020   Procedure: DILATATION AND CURETTAGE /HYSTEROSCOPY;  Surgeon: Christeen Douglas, MD;  Location: [facility];  Service: Gynecology;  Laterality: N/A;   LAPAROSCOPY N/A 12/11/2020   Procedure: LAPAROSCOPY DIAGNOSTIC,  EXCISION OF UTERINE SEPTUM;  Surgeon: Christeen Douglas, MD;  Location: [facility];  Service: Gynecology;  Laterality: N/A;    Family History  Problem Relation Age of Onset   Alcohol abuse Father    Drug abuse Father    Diabetes Maternal Grandfather    Obesity Maternal Grandfather    Arthritis Maternal Grandmother    Asthma Maternal Grandmother    Obesity Maternal Grandmother    Stroke Maternal Grandmother    Obesity Maternal Aunt     Social History   Tobacco Use   Smoking status: Never   Smokeless tobacco: Never  Vaping Use   Vaping status: Former  Substance Use Topics   Alcohol use: Yes   Drug use: No     Current Outpatient Medications:    clindamycin-benzoyl peroxide (BENZACLIN) gel, Apply topically 2 (two) times daily., Disp: 25 g, Rfl: 1   doxycycline (VIBRA-TABS) 100 MG tablet, Take 1 tablet (100 mg total) by mouth daily., Disp: 90 tablet, Rfl: 0   ibuprofen (ADVIL) 600 MG tablet, Take 1 tablet (600 mg total) by mouth every 6 (six) hours as needed for fever, headache, cramping or mild pain., Disp: 30 tablet, Rfl: 1  tretinoin (RETIN-A) 0.025 % cream, Apply topically at bedtime., Disp: 45 g, Rfl: 0   acetaminophen (TYLENOL) 325 MG tablet, Take 2 tablets (650 mg total) by mouth every 6 (six) hours as needed for mild pain or fever. (Patient not taking: Reported on 08/15/2022), Disp: 30 tablet, Rfl: 1   Prenatal Vit-Fe Fumarate-FA (PRENATAL MULTIVITAMIN) TABS tablet, Take 1 tablet by mouth daily at 12 noon. Post natal vitamin daily (Patient not taking:  Reported on 11/10/2022), Disp: , Rfl:   No Known Allergies  ROS: See pertinent positives and negatives per HPI.  OBSERVATIONS/OBJECTIVE:  VITALS per patient if applicable: There were no vitals filed for this visit. There is no height or weight on file to calculate BMI.    GENERAL: Alert and oriented. Appears well and in no acute distress.  HEENT: Atraumatic. Conjunctiva clear. No obvious abnormalities on inspection of external nose and ears.  NECK: Normal movements of the head and neck.  LUNGS: On inspection, no signs of respiratory distress. Breathing rate appears normal. No obvious gross SOB, gasping or wheezing, and no conversational dyspnea.  CV: No obvious cyanosis.  MS: Moves all visible extremities without noticeable abnormality.  PSYCH/NEURO: Pleasant and cooperative. No obvious depression or anxiety. Speech and thought processing grossly intact.  ASSESSMENT AND PLAN:  Problem List Items Addressed This Visit       Musculoskeletal and Integument   Acne vulgaris - Primary    Topical benzaclin has not helped with symptoms. Will start tretinoin cream 0.025% at bedtime and doxycyline 100mg  daily since she has stopped breast feeding. Phone number given to dermatology to call and schedule an appointment. Follow-up with any concerns.       Relevant Medications   tretinoin (RETIN-A) 0.025 % cream   doxycycline (VIBRA-TABS) 100 MG tablet     I discussed the assessment and treatment plan with the patient. The patient was provided an opportunity to ask questions and all were answered. The patient agreed with the plan and demonstrated an understanding of the instructions.   The patient was advised to call back or seek an in-person evaluation if the symptoms worsen or if the condition fails to improve as anticipated.   Gerre Scull, NP

## 2022-11-10 NOTE — Patient Instructions (Signed)
It was great to see you!  Start tretinoin cream daily at bedtime to affected areas   Start doxycycline 1 capsule daily with food. Try to increase yogurt or probiotics.   Let's follow-up if symptoms worsen or any concerns.   Take care,  Rodman Pickle, NP

## 2022-11-17 ENCOUNTER — Other Ambulatory Visit: Payer: Self-pay | Admitting: Nurse Practitioner

## 2022-11-26 ENCOUNTER — Other Ambulatory Visit: Payer: Self-pay | Admitting: Nurse Practitioner

## 2022-11-30 ENCOUNTER — Other Ambulatory Visit: Payer: Self-pay | Admitting: Nurse Practitioner

## 2022-11-30 MED ORDER — RETIN-A 0.025 % EX CREA: TOPICAL_CREAM | Freq: Every day | CUTANEOUS | 0 refills | Status: AC

## 2023-01-19 ENCOUNTER — Encounter: Payer: Self-pay | Admitting: Nurse Practitioner

## 2023-02-23 ENCOUNTER — Encounter: Payer: Self-pay | Admitting: Nurse Practitioner

## 2023-03-01 ENCOUNTER — Telehealth (INDEPENDENT_AMBULATORY_CARE_PROVIDER_SITE_OTHER): Payer: Medicaid Other | Admitting: Nurse Practitioner

## 2023-03-01 ENCOUNTER — Encounter: Payer: Self-pay | Admitting: Nurse Practitioner

## 2023-03-01 DIAGNOSIS — L700 Acne vulgaris: Secondary | ICD-10-CM

## 2023-03-01 MED ORDER — DOXYCYCLINE HYCLATE 100 MG PO TABS: 100.0000 mg | ORAL_TABLET | Freq: Every day | ORAL | 0 refills | Status: DC

## 2023-03-01 NOTE — Assessment & Plan Note (Signed)
Chronic, not controlled. She states that she was unable to afford the benzaclin gel but has been using topical retin A cream. She did 90 days of doxycycline which helped, however her symptoms came back once she finished. She has an appointment with Dermatology in April. Will refill doxycyline 100mg  daily until she can see dermatology.

## 2023-03-01 NOTE — Progress Notes (Signed)
[facility] [address] [place] [address] Dept: [phone] Dept Fax: [phone]  Virtual Video Visit  I connected with Sharon Houston on 03/01/23 at  3:00 PM EST by a video enabled telemedicine application and verified that I am speaking with the correct person using two identifiers.  Location patient: Home Location provider: Clinic Persons participating in the virtual visit: Patient; Rodman Pickle, NP; Mary Sella, CMA  I discussed the limitations of evaluation and management by telemedicine and the availability of in person appointments. The patient expressed understanding and agreed to proceed.  Chief Complaint  Patient presents with   Medication Refill    SUBJECTIVE:  HPI: Sharon Houston is a 28 y.o. female who presents to follow-up on acne. She is currently using retin-a cream, and was unable to afford the benzaclin. She did a course of doxycycline PO for 3 months and finished in December She states this did help, however it came back when she stopped the antibiotic. She has an appointment with dermatology in April, but would like something else to try before then.   Patient Active Problem List   Diagnosis Date Noted   Malpresentation of fetus, antepartum 04/22/2022   Supervision of other normal pregnancy, antepartum 04/22/2022   [redacted] weeks gestation of pregnancy 04/22/2022   Acne vulgaris 08/25/2021    Past Surgical History:  Procedure Laterality Date   BIOPSY Bilateral 12/11/2020   Procedure: BIOPSY of peritoneum;  Surgeon: Christeen Douglas, MD;  Location: [facility];  Service: Gynecology;  Laterality: Bilateral;   CESAREAN SECTION N/A 04/22/2022   Procedure: CESAREAN SECTION;  Surgeon: Conard Novak, MD;  Location: [facility];  Service: Obstetrics;  Laterality: N/A;   DILATION AND EVACUATION N/A 04/01/2020   Procedure: DILATATION AND EVACUATION;  Surgeon: Christeen Douglas, MD;  Location: [facility];   Service: Gynecology;  Laterality: N/A;   FRACTURE SURGERY  1996   leg was wrapped in blanket as a child caused hairline fracture in leg   HYSTEROSCOPY WITH D & C N/A 12/11/2020   Procedure: DILATATION AND CURETTAGE /HYSTEROSCOPY;  Surgeon: Christeen Douglas, MD;  Location: [facility];  Service: Gynecology;  Laterality: N/A;   LAPAROSCOPY N/A 12/11/2020   Procedure: LAPAROSCOPY DIAGNOSTIC,  EXCISION OF UTERINE SEPTUM;  Surgeon: Christeen Douglas, MD;  Location: [facility];  Service: Gynecology;  Laterality: N/A;    Family History  Problem Relation Age of Onset   Alcohol abuse Father    Drug abuse Father    Diabetes Maternal Grandfather    Obesity Maternal Grandfather    Arthritis Maternal Grandmother    Asthma Maternal Grandmother    Obesity Maternal Grandmother    Stroke Maternal Grandmother    Obesity Maternal Aunt     Social History   Tobacco Use   Smoking status: Never   Smokeless tobacco: Never  Vaping Use   Vaping status: Former  Substance Use Topics   Alcohol use: Yes   Drug use: No     Current Outpatient Medications:    RETIN-A 0.025 % cream, Apply topically at bedtime. Apply topically at bedtime. Apply 0.25mg  to affected areas at bedtime (thin layer), Disp: 45 g, Rfl: 0   acetaminophen (TYLENOL) 325 MG tablet, Take 2 tablets (650 mg total) by mouth every 6 (six) hours as needed for mild pain or fever. (Patient not taking: Reported on 03/01/2023), Disp: 30 tablet, Rfl: 1   doxycycline (VIBRA-TABS) 100 MG tablet, Take 1 tablet (100 mg total) by mouth daily., Disp: 90  tablet, Rfl: 0   ibuprofen (ADVIL) 600 MG tablet, Take 1 tablet (600 mg total) by mouth every 6 (six) hours as needed for fever, headache, cramping or mild pain. (Patient not taking: Reported on 03/01/2023), Disp: 30 tablet, Rfl: 1  No Known Allergies  ROS: See pertinent positives and negatives per HPI.  OBSERVATIONS/OBJECTIVE:  VITALS per patient if applicable: There were no vitals filed for this  visit. There is no height or weight on file to calculate BMI.    GENERAL: Alert and oriented. Appears well and in no acute distress.  HEENT: Atraumatic. Conjunctiva clear. No obvious abnormalities on inspection of external nose and ears.  NECK: Normal movements of the head and neck.  LUNGS: On inspection, no signs of respiratory distress. Breathing rate appears normal. No obvious gross SOB, gasping or wheezing, and no conversational dyspnea.  Skin: inflamed areas of redness on right cheek and chin  CV: No obvious cyanosis.  MS: Moves all visible extremities without noticeable abnormality.  PSYCH/NEURO: Pleasant and cooperative. No obvious depression or anxiety. Speech and thought processing grossly intact.  ASSESSMENT AND PLAN:  Problem List Items Addressed This Visit       Musculoskeletal and Integument   Acne vulgaris - Primary   Chronic, not controlled. She states that she was unable to afford the benzaclin gel but has been using topical retin A cream. She did 90 days of doxycycline which helped, however her symptoms came back once she finished. She has an appointment with Dermatology in April. Will refill doxycyline 100mg  daily until she can see dermatology.       Relevant Medications   doxycycline (VIBRA-TABS) 100 MG tablet     I discussed the assessment and treatment plan with the patient. The patient was provided an opportunity to ask questions and all were answered. The patient agreed with the plan and demonstrated an understanding of the instructions.   The patient was advised to call back or seek an in-person evaluation if the symptoms worsen or if the condition fails to improve as anticipated.   Gerre Scull, NP

## 2023-03-01 NOTE — Patient Instructions (Signed)
It was great to see you!  Start doxycycline 1 daily with food  Keep using the retin a cream  Keep your appointment with dermatology in April.   Let's follow-up if symptoms worsen or any concerns.   Take care,  Rodman Pickle, NP

## 2023-09-18 ENCOUNTER — Encounter: Payer: Self-pay | Admitting: Nurse Practitioner

## 2023-11-22 ENCOUNTER — Encounter: Payer: Self-pay | Admitting: Nurse Practitioner

## 2023-11-22 MED ORDER — SULFAMETHOXAZOLE-TRIMETHOPRIM 800-160 MG PO TABS: 1.0000 | ORAL_TABLET | Freq: Two times a day (BID) | ORAL | 0 refills | Status: AC
